# Patient Record
Sex: Female | Born: 1980 | Race: Black or African American | Hispanic: No | Marital: Married | Smoking: Never smoker
Health system: Southern US, Community
[De-identification: ages and names within clinical notes are randomized; demographics above are authoritative.]

## PROBLEM LIST (undated history)

## (undated) DIAGNOSIS — F329 Major depressive disorder, single episode, unspecified: Secondary | ICD-10-CM

## (undated) DIAGNOSIS — M797 Fibromyalgia: Secondary | ICD-10-CM

## (undated) DIAGNOSIS — F419 Anxiety disorder, unspecified: Secondary | ICD-10-CM

---
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
## 2001-04-03 ENCOUNTER — Other Ambulatory Visit: Admission: RE | Admit: 2001-04-03 | Discharge: 2001-04-03 | Payer: Self-pay | Admitting: Gynecology

## 2001-07-06 ENCOUNTER — Encounter: Payer: Self-pay | Admitting: Urology

## 2001-07-06 ENCOUNTER — Encounter: Admission: RE | Admit: 2001-07-06 | Discharge: 2001-07-06 | Payer: Self-pay | Admitting: Urology

## 2001-08-15 ENCOUNTER — Other Ambulatory Visit: Admission: RE | Admit: 2001-08-15 | Discharge: 2001-08-15 | Payer: Self-pay | Admitting: *Deleted

## 2001-10-10 ENCOUNTER — Inpatient Hospital Stay (HOSPITAL_COMMUNITY): Admission: AD | Admit: 2001-10-10 | Discharge: 2001-10-10 | Payer: Self-pay | Admitting: *Deleted

## 2002-02-01 ENCOUNTER — Other Ambulatory Visit: Admission: RE | Admit: 2002-02-01 | Discharge: 2002-02-01 | Payer: Self-pay | Admitting: *Deleted

## 2002-03-15 ENCOUNTER — Emergency Department (HOSPITAL_COMMUNITY): Admission: EM | Admit: 2002-03-15 | Discharge: 2002-03-15 | Payer: Self-pay | Admitting: Emergency Medicine

## 2002-07-11 ENCOUNTER — Inpatient Hospital Stay (HOSPITAL_COMMUNITY): Admission: AD | Admit: 2002-07-11 | Discharge: 2002-07-11 | Payer: Self-pay | Admitting: *Deleted

## 2002-08-08 ENCOUNTER — Inpatient Hospital Stay (HOSPITAL_COMMUNITY): Admission: AD | Admit: 2002-08-08 | Discharge: 2002-08-10 | Payer: Self-pay | Admitting: *Deleted

## 2003-04-03 ENCOUNTER — Other Ambulatory Visit: Admission: RE | Admit: 2003-04-03 | Discharge: 2003-04-03 | Payer: Self-pay | Admitting: *Deleted

## 2003-06-22 ENCOUNTER — Inpatient Hospital Stay (HOSPITAL_COMMUNITY): Admission: AD | Admit: 2003-06-22 | Discharge: 2003-06-22 | Payer: Self-pay | Admitting: *Deleted

## 2004-04-17 ENCOUNTER — Emergency Department (HOSPITAL_COMMUNITY): Admission: EM | Admit: 2004-04-17 | Discharge: 2004-04-18 | Payer: Self-pay | Admitting: *Deleted

## 2005-05-26 ENCOUNTER — Inpatient Hospital Stay (HOSPITAL_COMMUNITY): Admission: AD | Admit: 2005-05-26 | Discharge: 2005-05-26 | Payer: Self-pay | Admitting: Obstetrics

## 2005-11-16 ENCOUNTER — Inpatient Hospital Stay (HOSPITAL_COMMUNITY): Admission: AD | Admit: 2005-11-16 | Discharge: 2005-11-16 | Payer: Self-pay | Admitting: Obstetrics & Gynecology

## 2007-05-02 ENCOUNTER — Inpatient Hospital Stay (HOSPITAL_COMMUNITY): Admission: AD | Admit: 2007-05-02 | Discharge: 2007-05-02 | Payer: Self-pay | Admitting: Obstetrics

## 2008-03-06 ENCOUNTER — Emergency Department (HOSPITAL_COMMUNITY): Admission: EM | Admit: 2008-03-06 | Discharge: 2008-03-06 | Payer: Self-pay | Admitting: Emergency Medicine

## 2008-03-27 ENCOUNTER — Emergency Department (HOSPITAL_COMMUNITY): Admission: EM | Admit: 2008-03-27 | Discharge: 2008-03-27 | Payer: Self-pay | Admitting: Emergency Medicine

## 2008-03-30 ENCOUNTER — Emergency Department (HOSPITAL_COMMUNITY): Admission: EM | Admit: 2008-03-30 | Discharge: 2008-03-30 | Payer: Self-pay | Admitting: Emergency Medicine

## 2009-12-18 ENCOUNTER — Emergency Department (HOSPITAL_COMMUNITY): Admission: EM | Admit: 2009-12-18 | Discharge: 2009-12-18 | Payer: Self-pay | Admitting: Emergency Medicine

## 2010-10-09 ENCOUNTER — Ambulatory Visit (HOSPITAL_COMMUNITY): Payer: BC Managed Care – HMO | Admitting: Psychiatry

## 2010-10-09 DIAGNOSIS — F331 Major depressive disorder, recurrent, moderate: Secondary | ICD-10-CM

## 2010-10-23 ENCOUNTER — Encounter (HOSPITAL_COMMUNITY): Payer: BC Managed Care – HMO | Admitting: Psychiatry

## 2010-11-03 LAB — POCT I-STAT, CHEM 8
BUN: 11 mg/dL (ref 6–23)
Chloride: 108 mEq/L (ref 96–112)
Creatinine, Ser: 0.8 mg/dL (ref 0.4–1.2)
Potassium: 3.6 mEq/L (ref 3.5–5.1)
Sodium: 140 mEq/L (ref 135–145)
TCO2: 22 mmol/L (ref 0–100)
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
## 2011-01-01 NOTE — Op Note (Signed)
   NAME:  EVERLI, ROTHER NO.:  [id number]   MEDICAL RECORD NO.:  [id number]                   PATIENT TYPE:  INP   LOCATION:  9102                                 FACILITY:  [facility]   PHYSICIAN:  Georgina Peer, M.D.              DATE OF BIRTH:  1981-01-01   DATE OF PROCEDURE:  08/08/2002  DATE OF DISCHARGE:                                 OPERATIVE REPORT   OPERATIVE DELIVERY NOTE:   PREDELIVERY DIAGNOSES:  Pregnancy, 40-1/7 weeks, maternal fatigue in second  stage, vertex at +2 station.   POST-DELIVERY DIAGNOSES:  Pregnancy, 40-1/7 weeks, maternal fatigue in  second stage, vertex at +2 station, with viable female infant delivered at  1419, Apgars 8 and 9.   INDICATIONS:  A 30 year old, gravida 2, para 0, 0-1-0, EDC 08/07/02,  presented in labor, progressed to full dilatation at 1150, pushed for two  hours and became fatigued.  The vertex was at +2 station but did not descend  further.  She asked for assistance.  Vacuum assistance was offered with the  Kiwi vacuum.  Risk of accentuating caput scalp or head lacerations,  infection, or hemorrhage was also described and accepted.  The Kiwi was  applied with fetal heart rate reactive and within two contractions, with no  popoffs, a vaginal delivery was effected.  There was terminal bradycardia  with traction, and the infant was a female, delivered at 10, who was  immediately vigorous.  There were no anomalies.  Cord was cut after being  clamped.  There was a second-degree laceration from an edematous perineum  which was noted but no interference with the rectal mucosa or sphincter.  Again, Apgars were 8 and 9.  The perineum was repaired.  The placenta  delivered at 1428, three vessels intact.  It was bilobed and battledore with  no extraneous vessels at the outer margins of the membranes.  Blood loss was  400 cc.  The uterus was well contracted.  Anesthesia for this was epidural.                           Georgina Peer, M.D.    JPN/MEDQ  D:  08/08/2002  T:  08/09/2002  Job:  [id number]

## 2011-04-05 ENCOUNTER — Other Ambulatory Visit: Payer: Self-pay | Admitting: Obstetrics and Gynecology

## 2011-04-05 DIAGNOSIS — Z1231 Encounter for screening mammogram for malignant neoplasm of breast: Secondary | ICD-10-CM

## 2011-04-05 DIAGNOSIS — Z803 Family history of malignant neoplasm of breast: Secondary | ICD-10-CM

## 2011-04-14 ENCOUNTER — Ambulatory Visit: Payer: BC Managed Care – HMO

## 2011-04-22 ENCOUNTER — Ambulatory Visit: Payer: BC Managed Care – HMO

## 2011-05-10 ENCOUNTER — Ambulatory Visit
Admission: RE | Admit: 2011-05-10 | Discharge: 2011-05-10 | Disposition: A | Discharge status: Home / Self Care | Payer: BC Managed Care – HMO | Source: Ambulatory Visit | Attending: Obstetrics and Gynecology | Admitting: Obstetrics and Gynecology

## 2011-05-10 DIAGNOSIS — Z1231 Encounter for screening mammogram for malignant neoplasm of breast: Secondary | ICD-10-CM

## 2011-05-10 DIAGNOSIS — Z803 Family history of malignant neoplasm of breast: Secondary | ICD-10-CM

## 2011-05-14 LAB — POCT I-STAT, CHEM 8
Creatinine, Ser: 0.9
HCT: 36
Hemoglobin: 12.2
Potassium: 3 — ABNORMAL LOW
Sodium: 141
TCO2: 23

## 2011-05-14 LAB — POCT PREGNANCY, URINE
Operator id: 22937
Preg Test, Ur: POSITIVE

## 2011-05-14 LAB — ABO/RH: ABO/RH(D): A POS

## 2011-05-14 LAB — HCG, QUANTITATIVE, PREGNANCY: hCG, Beta Chain, Quant, S: 265 — ABNORMAL HIGH

## 2011-05-19 ENCOUNTER — Other Ambulatory Visit: Payer: Self-pay | Admitting: Obstetrics and Gynecology

## 2011-05-19 DIAGNOSIS — R928 Other abnormal and inconclusive findings on diagnostic imaging of breast: Secondary | ICD-10-CM

## 2011-05-25 ENCOUNTER — Ambulatory Visit
Admission: RE | Admit: 2011-05-25 | Discharge: 2011-05-25 | Disposition: A | Discharge status: Home / Self Care | Payer: BC Managed Care – HMO | Source: Ambulatory Visit | Attending: Obstetrics and Gynecology | Admitting: Obstetrics and Gynecology

## 2011-05-25 DIAGNOSIS — R928 Other abnormal and inconclusive findings on diagnostic imaging of breast: Secondary | ICD-10-CM

## 2011-05-27 LAB — GC/CHLAMYDIA PROBE AMP, GENITAL: GC Probe Amp, Genital: NEGATIVE

## 2011-05-27 LAB — WET PREP, GENITAL: Trich, Wet Prep: NONE SEEN

## 2011-05-27 LAB — POCT PREGNANCY, URINE
Operator id: 114931
Preg Test, Ur: NEGATIVE

## 2011-06-01 ENCOUNTER — Other Ambulatory Visit: Payer: BC Managed Care – HMO

## 2011-12-24 ENCOUNTER — Other Ambulatory Visit: Payer: Self-pay | Admitting: Obstetrics

## 2011-12-24 DIAGNOSIS — N644 Mastodynia: Secondary | ICD-10-CM

## 2011-12-31 ENCOUNTER — Ambulatory Visit
Admission: RE | Admit: 2011-12-31 | Discharge: 2011-12-31 | Disposition: A | Discharge status: Home / Self Care | Payer: BC Managed Care – PPO | Source: Ambulatory Visit | Attending: Obstetrics | Admitting: Obstetrics

## 2011-12-31 DIAGNOSIS — N644 Mastodynia: Secondary | ICD-10-CM

## 2012-02-28 ENCOUNTER — Encounter (HOSPITAL_COMMUNITY): Payer: Self-pay | Admitting: Emergency Medicine

## 2012-02-28 ENCOUNTER — Emergency Department (HOSPITAL_COMMUNITY)
Admission: EM | Admit: 2012-02-28 | Discharge: 2012-02-28 | Disposition: A | Discharge status: Home / Self Care | Payer: BC Managed Care – PPO | Attending: Emergency Medicine | Admitting: Emergency Medicine

## 2012-02-28 DIAGNOSIS — N898 Other specified noninflammatory disorders of vagina: Secondary | ICD-10-CM | POA: Insufficient documentation

## 2012-02-28 DIAGNOSIS — IMO0002 Reserved for concepts with insufficient information to code with codable children: Secondary | ICD-10-CM | POA: Insufficient documentation

## 2012-02-28 DIAGNOSIS — R350 Frequency of micturition: Secondary | ICD-10-CM | POA: Insufficient documentation

## 2012-02-28 DIAGNOSIS — F32A Depression, unspecified: Secondary | ICD-10-CM

## 2012-02-28 DIAGNOSIS — R319 Hematuria, unspecified: Secondary | ICD-10-CM | POA: Insufficient documentation

## 2012-02-28 DIAGNOSIS — R109 Unspecified abdominal pain: Secondary | ICD-10-CM | POA: Insufficient documentation

## 2012-02-28 DIAGNOSIS — N390 Urinary tract infection, site not specified: Secondary | ICD-10-CM

## 2012-02-28 DIAGNOSIS — N949 Unspecified condition associated with female genital organs and menstrual cycle: Secondary | ICD-10-CM | POA: Insufficient documentation

## 2012-02-28 DIAGNOSIS — F3289 Other specified depressive episodes: Secondary | ICD-10-CM | POA: Insufficient documentation

## 2012-02-28 DIAGNOSIS — F329 Major depressive disorder, single episode, unspecified: Secondary | ICD-10-CM | POA: Diagnosis present

## 2012-02-28 HISTORY — DX: Major depressive disorder, single episode, unspecified: F32.9

## 2012-02-28 HISTORY — DX: Depression, unspecified: F32.A

## 2012-02-28 LAB — WET PREP, GENITAL: Yeast Wet Prep HPF POC: NONE SEEN

## 2012-02-28 LAB — URINALYSIS, ROUTINE W REFLEX MICROSCOPIC
Glucose, UA: NEGATIVE mg/dL
Protein, ur: 100 mg/dL — AB
Specific Gravity, Urine: 1.033 — ABNORMAL HIGH (ref 1.005–1.030)

## 2012-02-28 LAB — URINE MICROSCOPIC-ADD ON

## 2012-02-28 LAB — POCT PREGNANCY, URINE: Preg Test, Ur: NEGATIVE

## 2012-02-28 MED ORDER — MORPHINE SULFATE 2 MG/ML IJ SOLN
2.0000 mg | Freq: Once | INTRAMUSCULAR | Status: AC
  Administered 2012-02-28: 2 mg via INTRAMUSCULAR
  Filled 2012-02-28: qty 1

## 2012-02-28 MED ORDER — CEPHALEXIN 500 MG PO CAPS: 500.0000 mg | ORAL_CAPSULE | Freq: Two times a day (BID) | ORAL | Status: AC

## 2012-02-28 MED ORDER — HYDROCODONE-ACETAMINOPHEN 5-325 MG PO TABS: 1.0000 | ORAL_TABLET | Freq: Four times a day (QID) | ORAL | Status: AC | PRN

## 2012-02-28 NOTE — ED Provider Notes (Signed)
History     CSN: [id number]  Arrival date & time 02/28/12  0222   First MD Initiated Contact with Patient 02/28/12 [phone]      Chief Complaint  Patient presents with  . Abdominal Pain  . Dysuria    (Consider location/radiation/quality/duration/timing/severity/associated sxs/prior treatment) HPI  31 year old F who presents with a 3 hour history of pain in her pelvic area and urinary frequency. She denies nausea, vomiting, fever, chills, vaginal discharge, and pain with intercourse. She is also having vaginal bleeding noted on toilet paper with a few drops in the bowl;She has no history of sick contacts; no history of abdominal or pelvic surgery. sees Dr. Clearance Coots GYN on a regular basis and no other doctors.  Past Medical History  Diagnosis Date  . Depression 02/28/2012    History reviewed. No pertinent past surgical history.  Family History  Problem Relation Age of Onset  . Breast cancer Mother 29  . Breast cancer      History  Substance Use Topics  . Smoking status: Never Smoker   . Smokeless tobacco: Not on file  . Alcohol Use: Yes    OB History    Grav Para Term Preterm Abortions TAB SAB Ect Mult Living                  Review of Systems  Constitutional: Negative.   HENT: Negative.   Eyes: Negative.   Respiratory: Negative.   Cardiovascular: Negative.   Gastrointestinal: Negative.   Genitourinary: Positive for frequency, hematuria and pelvic pain. Negative for flank pain, vaginal discharge, enuresis and genital sores.  Musculoskeletal: Negative.   Neurological: Negative.   Hematological: Negative.   Psychiatric/Behavioral: Negative.     Allergies  Review of patient's allergies indicates no known allergies.  Home Medications   Current Outpatient Rx  Name Route Sig Dispense Refill  . ZOLOFT PO Oral Take 1 tablet by mouth daily.    . CEPHALEXIN 500 MG PO CAPS Oral Take 1 capsule (500 mg total) by mouth 2 (two) times daily. 14 capsule 0  .  HYDROCODONE-ACETAMINOPHEN 5-325 MG PO TABS Oral Take 1 tablet by mouth every 6 (six) hours as needed for pain. 20 tablet 0    BP 118/70  Pulse 93  Temp 98 F (36.7 C) (Oral)  Resp 16  SpO2 100%  Physical Exam  Vitals reviewed. Constitutional: She is oriented to person, place, and time. She appears well-developed and well-nourished.       Young AAF, mildly distressed, conversant  HENT:  Head: Normocephalic and atraumatic.  Mouth/Throat: No oropharyngeal exudate.  Eyes: Conjunctivae and EOM are normal. Pupils are equal, round, and reactive to light.  Neck: Normal range of motion. Neck supple.  Cardiovascular: Normal rate, regular rhythm and normal heart sounds.   Pulmonary/Chest: Effort normal and breath sounds normal.  Abdominal: Soft. Normal appearance. There is tenderness in the right lower quadrant, suprapubic area and left lower quadrant. There is no rigidity, no rebound, no guarding, no CVA tenderness, no tenderness at McBurney's point and negative Murphy's sign.  Genitourinary: Pelvic exam was performed with patient prone. There is no rash or tenderness on the right labia. There is no rash or tenderness on the left labia. Cervix exhibits no motion tenderness and no discharge. Right adnexum displays no mass and no tenderness. Left adnexum displays no mass and no tenderness. Vaginal discharge found.  Musculoskeletal: Normal range of motion. She exhibits no edema and no tenderness.  Neurological: She is alert and  oriented to person, place, and time.  Skin: Skin is warm and dry. No rash noted. She is not diaphoretic.  Psychiatric: She has a normal mood and affect. Her behavior is normal. Thought content normal.    ED Course  Procedures (including critical care time)  Labs Reviewed  URINALYSIS, ROUTINE W REFLEX MICROSCOPIC - Abnormal; Notable for the following:    Color, Urine RED (*)  BIOCHEMICALS MAY BE AFFECTED BY COLOR   APPearance TURBID (*)     Specific Gravity, Urine 1.033  (*)     Hgb urine dipstick LARGE (*)     Bilirubin Urine SMALL (*)     Ketones, ur 15 (*)     Protein, ur 100 (*)     Nitrite POSITIVE (*)     Leukocytes, UA LARGE (*)     All other components within normal limits  URINE MICROSCOPIC-ADD ON - Abnormal; Notable for the following:    Squamous Epithelial / LPF FEW (*)     Bacteria, UA FEW (*)     All other components within normal limits  WET PREP, GENITAL - Abnormal; Notable for the following:    Clue Cells Wet Prep HPF POC FEW (*)     All other components within normal limits  POCT PREGNANCY, URINE  URINE CULTURE  GC/CHLAMYDIA PROBE AMP, GENITAL   No results found.   1. Urinary tract infection   2. Hematuria       MDM  Mrs. Landgren presents with hematuria, urinary frequency, and abdominal pain which correspond to the urinalysis that showed evidence of a urinary tract infection. She was afebrile, without nausea and vomiting, and had not CVA tenderness. Therefore, my suspicion for pyelonephritis is very low. She was given morphine, which relieved the pelvic pain. She was instructed to take PO antibiotics for 7 days upon discharge are follow-up with her PCP to make sure the hematuria resolved. The was stable, appropriate for discharge, and in agreement with the plan.         Garnetta Buddy, MD 02/28/12 [phone]

## 2012-02-28 NOTE — ED Provider Notes (Signed)
I  reviewed the resident's note and I agree with the findings and plan.    Nelia Shi, MD 02/28/12 [phone]

## 2012-02-28 NOTE — ED Notes (Signed)
Pt. D/C home. Ambulatory. A.O. X4. Denies pain. Instructed to complete full course of antibiotics. Instructed not to drive while on Norco, Instructed not to take Tylenol in addition to Norco. Instructed to increase fluids, and avoid sugary beverages. Instructed to follow-up with PCP if symptoms persist or get worse. Verbalized understanding.

## 2012-02-28 NOTE — ED Notes (Signed)
MD at bedside for pelvic exam.

## 2012-02-28 NOTE — ED Notes (Signed)
Patient currently resting quietly in bed; no respiratory or acute distress noted.  Patient updated on plan of care; informed patient that we are currently waiting on lab results to come back.  Patient has no other questions or concerns at this time; will continue to monitor. 

## 2012-02-28 NOTE — ED Notes (Signed)
Assisted resident with pelvic exam.

## 2012-02-28 NOTE — ED Notes (Signed)
Pelvic cart set up at bedside  

## 2012-02-28 NOTE — ED Notes (Signed)
Patient currently resting quietly in bed; no respiratory or acute distress noted.  Patient updated on plan of care; informed patient that EDP/resident will be at bedside to perform pelvic exam; patient has no other questions or concerns at this time; will continue to monitor.

## 2012-02-28 NOTE — ED Notes (Addendum)
Pt reports onset of abd pain and pain with urination began tonight also states she has blood in her urine also reports frequency, and small amounts urine output, LMP June 30th. Advil PM taken this PM w/o relief

## 2012-02-28 NOTE — ED Notes (Addendum)
PT. Reports being awakened from sleeping with bilateral lower abdominal pain and cramping. States "I went to the bathroom a few times and there was blood in my urine. Bright red the first time, then dull after." Pt. Denies clots in urine. Denies vaginal itching or discomfort. Denies order.  Denies and prior episode of this type of pain. Denies any other pain. A.O. X 4. Family at bedside. NAD.

## 2012-03-02 LAB — URINE CULTURE: Colony Count: 25000

## 2012-03-03 NOTE — ED Notes (Signed)
+  Urine. Patient treated with Keflex. Sensitive to same. Per protocol MD. °

## 2012-09-16 ENCOUNTER — Encounter (HOSPITAL_COMMUNITY): Payer: Self-pay | Admitting: Emergency Medicine

## 2012-09-16 ENCOUNTER — Emergency Department (HOSPITAL_COMMUNITY)
Admission: EM | Admit: 2012-09-16 | Discharge: 2012-09-16 | Disposition: A | Discharge status: Home / Self Care | Payer: BC Managed Care – PPO | Source: Home / Self Care | Attending: Emergency Medicine | Admitting: Emergency Medicine

## 2012-09-16 DIAGNOSIS — J069 Acute upper respiratory infection, unspecified: Secondary | ICD-10-CM

## 2012-09-16 HISTORY — DX: Fibromyalgia: M79.7

## 2012-09-16 MED ORDER — CETIRIZINE HCL 10 MG PO TABS: 10.0000 mg | ORAL_TABLET | Freq: Every day | ORAL | Status: DC

## 2012-09-16 MED ORDER — SALINE NASAL SPRAY 0.65 % NA SOLN: 1.0000 | NASAL | Status: DC | PRN

## 2012-09-16 NOTE — ED Provider Notes (Signed)
Medical screening examination/treatment/procedure(s) were performed by non-physician practitioner and as supervising physician I was immediately available for consultation/collaboration.  Leslee Home, M.D.   Reuben Likes, MD 09/16/12 [phone]

## 2012-09-16 NOTE — ED Provider Notes (Signed)
History     CSN: [id number]  Arrival date & time 09/16/12  1312   First MD Initiated Contact with Patient 09/16/12 1620      Chief Complaint  Patient presents with  . URI    hard to breathe. sinus pressure and pain. hurts to swallow    (Consider location/radiation/quality/duration/timing/severity/associated sxs/prior treatment) Patient is a 32 y.o. female presenting with URI. The history is provided by the patient.  URI The primary symptoms include sore throat and cough. The current episode started yesterday. This is a new problem. The problem has not changed since onset. The onset of the illness is associated with exposure to sick contacts. Symptoms associated with the illness include sinus pressure and congestion.    Past Medical History  Diagnosis Date  . Depression 02/28/2012  . Fibromyalgia     History reviewed. No pertinent past surgical history.  Family History  Problem Relation Age of Onset  . Breast cancer Mother 68  . Breast cancer      History  Substance Use Topics  . Smoking status: Never Smoker   . Smokeless tobacco: Not on file  . Alcohol Use: Yes    OB History    Grav Para Term Preterm Abortions TAB SAB Ect Mult Living                  Review of Systems  HENT: Positive for congestion, sore throat and sinus pressure.   Respiratory: Positive for cough.   All other systems reviewed and are negative.    Allergies  Review of patient's allergies indicates no known allergies.  Home Medications   Current Outpatient Rx  Name  Route  Sig  Dispense  Refill  . FLEXERIL PO   Oral   Take by mouth.         . TRAMADOL HCL 50 MG PO TABS   Oral   Take 50 mg by mouth every 6 (six) hours as needed.         Marland Kitchen ZOLOFT PO   Oral   Take 1 tablet by mouth daily.           BP 139/90  Pulse 80  Temp 98.4 F (36.9 C) (Oral)  Resp 18  SpO2 100%  LMP 08/15/2012  Physical Exam  Nursing note and vitals reviewed. Constitutional: She is oriented  to person, place, and time. Vital signs are normal. She appears well-developed and well-nourished. She is active and cooperative.  HENT:  Head: Normocephalic.  Right Ear: External ear normal. A middle ear effusion is present.  Left Ear: Tympanic membrane and external ear normal.  Nose: Nose normal. Right sinus exhibits no maxillary sinus tenderness and no frontal sinus tenderness. Left sinus exhibits no maxillary sinus tenderness and no frontal sinus tenderness.  Mouth/Throat: Uvula is midline, oropharynx is clear and moist and mucous membranes are normal.  Eyes: Conjunctivae normal are normal. Pupils are equal, round, and reactive to light. No scleral icterus.  Neck: Trachea normal and normal range of motion. Neck supple.  Cardiovascular: Normal rate, regular rhythm, normal heart sounds, intact distal pulses and normal pulses.   Pulmonary/Chest: Effort normal and breath sounds normal.  Lymphadenopathy:       Head (right side): No submental, no submandibular, no tonsillar, no preauricular, no posterior auricular and no occipital adenopathy present.       Head (left side): No submental, no submandibular, no tonsillar, no preauricular, no posterior auricular and no occipital adenopathy present.    She has  no cervical adenopathy.  Neurological: She is alert and oriented to person, place, and time. No cranial nerve deficit or sensory deficit. GCS eye subscore is 4. GCS verbal subscore is 5. GCS motor subscore is 6.  Skin: Skin is warm and dry.  Psychiatric: She has a normal mood and affect. Her speech is normal and behavior is normal. Judgment and thought content normal. Cognition and memory are normal.    ED Course  Procedures (including critical care time)  Labs Reviewed - No data to display No results found.   1. URI (upper respiratory infection)       MDM  Increase fluid intake, rest.  Antibiotics not indicated.  Begin expectorant/decongestant, topical decongestant, saline nasal  spray and/or saline irrigation, and cough suppressant at bedtime. Antihistamines of your choice (Claritin or Zyrtec).  Tylenol or Motrin for fever/discomfort.  Followup with PCP if not improving 5 to 7 days.       Johnsie Kindred, NP 09/16/12 1629

## 2012-09-16 NOTE — ED Notes (Signed)
Pt c/o sinus pressure and pain. Chest congestion. Hurts to swallow and is hard to breathe at times. Pt denies fever, n/v/d.  Pt has not tried any meds for symptoms. Symptoms present x the past ten hours.

## 2012-09-26 ENCOUNTER — Other Ambulatory Visit: Payer: Self-pay | Admitting: Obstetrics

## 2012-09-26 DIAGNOSIS — N644 Mastodynia: Secondary | ICD-10-CM

## 2012-11-01 ENCOUNTER — Ambulatory Visit: Payer: BC Managed Care – PPO | Admitting: Sports Medicine

## 2012-12-18 ENCOUNTER — Telehealth: Payer: Self-pay | Admitting: *Deleted

## 2012-12-18 DIAGNOSIS — B9689 Other specified bacterial agents as the cause of diseases classified elsewhere: Secondary | ICD-10-CM

## 2012-12-18 MED ORDER — METRONIDAZOLE 0.75 % VA GEL: 1.0000 | Freq: Every day | VAGINAL | Status: DC

## 2012-12-18 NOTE — Telephone Encounter (Signed)
Patient states that she has an abnormal discharge and is requesting [facility] Gel.  Patient states that she has been diagnosed with BV in the past.  Denies odor, pain, or itching. Ok per Dr. Clearance Coots - Metro Gel called to her pharmacy per nursing protocol. Patient request for hydrocodone was denied by Dr. Clearance Coots for her fibromyalgia.

## 2013-01-01 ENCOUNTER — Other Ambulatory Visit: Payer: Self-pay | Admitting: Obstetrics

## 2013-01-01 ENCOUNTER — Ambulatory Visit
Admission: RE | Admit: 2013-01-01 | Discharge: 2013-01-01 | Disposition: A | Discharge status: Home / Self Care | Payer: BC Managed Care – PPO | Source: Ambulatory Visit | Attending: Obstetrics | Admitting: Obstetrics

## 2013-01-01 DIAGNOSIS — N644 Mastodynia: Secondary | ICD-10-CM

## 2013-01-02 ENCOUNTER — Encounter: Payer: Self-pay | Admitting: Obstetrics

## 2013-01-11 ENCOUNTER — Encounter (HOSPITAL_COMMUNITY): Payer: Self-pay | Admitting: *Deleted

## 2013-01-11 DIAGNOSIS — F329 Major depressive disorder, single episode, unspecified: Secondary | ICD-10-CM | POA: Insufficient documentation

## 2013-01-11 DIAGNOSIS — F411 Generalized anxiety disorder: Secondary | ICD-10-CM | POA: Insufficient documentation

## 2013-01-11 DIAGNOSIS — F3289 Other specified depressive episodes: Secondary | ICD-10-CM | POA: Insufficient documentation

## 2013-01-11 DIAGNOSIS — IMO0001 Reserved for inherently not codable concepts without codable children: Secondary | ICD-10-CM | POA: Insufficient documentation

## 2013-01-11 NOTE — ED Notes (Signed)
[facility] today. Woke up with sob. No sob when sitting. Feel fatigued. X-ray showed enlarged heart. No ankle swelling.

## 2013-01-12 ENCOUNTER — Emergency Department (HOSPITAL_COMMUNITY)
Admission: EM | Admit: 2013-01-12 | Discharge: 2013-01-12 | Disposition: A | Discharge status: Home / Self Care | Payer: BC Managed Care – PPO | Attending: Emergency Medicine | Admitting: Emergency Medicine

## 2013-01-12 ENCOUNTER — Emergency Department (HOSPITAL_COMMUNITY)
Admit: 2013-01-12 | Discharge: 2013-01-12 | Disposition: A | Discharge status: Home / Self Care | Payer: BC Managed Care – PPO | Attending: Emergency Medicine | Admitting: Emergency Medicine

## 2013-01-12 DIAGNOSIS — F410 Panic disorder [episodic paroxysmal anxiety] without agoraphobia: Secondary | ICD-10-CM

## 2013-01-12 LAB — CBC
MCH: 29.2 pg (ref 26.0–34.0)
MCHC: 33.9 g/dL (ref 30.0–36.0)
MCV: 86 fL (ref 78.0–100.0)
Platelets: 388 10*3/uL (ref 150–400)
RDW: 13.1 % (ref 11.5–15.5)

## 2013-01-12 LAB — BASIC METABOLIC PANEL
Calcium: 9.4 mg/dL (ref 8.4–10.5)
Creatinine, Ser: 0.78 mg/dL (ref 0.50–1.10)
GFR calc Af Amer: >90 mL/min (ref >90)

## 2013-01-12 LAB — TROPONIN I: Troponin I: <0.3 ng/mL (ref <0.30)

## 2013-01-12 MED ORDER — ALPRAZOLAM 0.5 MG PO TABS: 0.5000 mg | ORAL_TABLET | Freq: Every evening | ORAL | Status: DC | PRN

## 2013-01-12 NOTE — ED Notes (Signed)
Delay explained to pt.  

## 2013-01-12 NOTE — ED Provider Notes (Signed)
History     CSN: [id number]  Arrival date & time 01/11/13  2337   First MD Initiated Contact with Patient 01/12/13 [phone]      Chief Complaint  Patient presents with  . Shortness of Breath    (Consider location/radiation/quality/duration/timing/severity/associated sxs/prior treatment) Patient is a 32 y.o. female presenting with shortness of breath. The history is provided by the patient. No language interpreter was used.  Shortness of Breath Severity:  Moderate Onset quality:  Sudden Timing:  Constant Progression:  Worsening Chronicity:  New Context: not activity   Relieved by:  Nothing Worsened by:  Nothing tried Ineffective treatments:  None tried Associated symptoms: no sore throat   Risk factors: no hx of PE/DVT     Past Medical History  Diagnosis Date  . Depression 02/28/2012  . Fibromyalgia     History reviewed. No pertinent past surgical history.  Family History  Problem Relation Age of Onset  . Breast cancer Mother 79  . Breast cancer      History  Substance Use Topics  . Smoking status: Never Smoker   . Smokeless tobacco: Not on file  . Alcohol Use: Yes    OB History   Grav Para Term Preterm Abortions TAB SAB Ect Mult Living                  Review of Systems  HENT: Negative for sore throat.   Respiratory: Positive for shortness of breath.   All other systems reviewed and are negative.    Allergies  Review of patient's allergies indicates no known allergies.  Home Medications   Current Outpatient Rx  Name  Route  Sig  Dispense  Refill  . ALPRAZolam (XANAX) 0.5 MG tablet   Oral   Take 0.5 mg by mouth 3 (three) times daily as needed for anxiety.         . traMADol (ULTRAM) 50 MG tablet   Oral   Take 50 mg by mouth every 6 (six) hours as needed.           BP 107/72  Pulse 78  Temp(Src) 98.1 F (36.7 C) (Oral)  Resp 18  SpO2 100%  LMP 01/08/2013  Physical Exam  Nursing note and vitals reviewed. Constitutional: She is  oriented to person, place, and time. She appears well-developed and well-nourished.  HENT:  Head: Normocephalic.  Right Ear: External ear normal.  Left Ear: External ear normal.  Eyes: Conjunctivae are normal. Pupils are equal, round, and reactive to light.  Neck: Normal range of motion.  Cardiovascular: Normal rate and normal heart sounds.   Pulmonary/Chest: Effort normal and breath sounds normal.  Abdominal: Soft.  Musculoskeletal: Normal range of motion.  Neurological: She is alert and oriented to person, place, and time. She has normal reflexes.  Skin: Skin is warm.  Psychiatric: She has a normal mood and affect.    ED Course  Procedures (including critical care time)  Labs Reviewed  CBC - Abnormal; Notable for the following:    HCT 35.7 (*)    All other components within normal limits  BASIC METABOLIC PANEL - Abnormal; Notable for the following:    Glucose, Bld 100 (*)    All other components within normal limits  TROPONIN I   Dg Chest 2 View  01/12/2013   *RADIOLOGY REPORT*  Clinical Data: Shortness of breath and lethargy.  CHEST - 2 VIEW  Comparison: Chest radiograph performed 12/18/2009  Findings: The lungs are well-aerated and clear.  There is  no evidence of focal opacification, pleural effusion or pneumothorax. Mild bibasilar density is thought to reflect overlying soft tissues.  The heart is borderline normal in size; the mediastinal contour is within normal limits.  No acute osseous abnormalities are seen.  IMPRESSION: No acute cardiopulmonary process seen.   Original Report Authenticated By: Tonia Ghent, M.D.     No diagnosis found.    Date: 01/12/2013  Rate: 72  Rhythm: normal sinus rhythm  QRS Axis: normal  Intervals: normal  ST/T Wave abnormalities: normal  Conduction Disutrbances:none  Narrative Interpretation:   Old EKG Reviewed: none available   MDM   Results for orders placed during the hospital encounter of 01/12/13  CBC      Result Value Range    WBC 6.8  4.0 - 10.5 K/uL   RBC 4.15  3.87 - 5.11 MIL/uL   Hemoglobin 12.1  12.0 - 15.0 g/dL   HCT 16.1 (*) 09.6 - 04.5 %   MCV 86.0  78.0 - 100.0 fL   MCH 29.2  26.0 - 34.0 pg   MCHC 33.9  30.0 - 36.0 g/dL   RDW 40.9  81.1 - 91.4 %   Platelets 388  150 - 400 K/uL  BASIC METABOLIC PANEL      Result Value Range   Sodium 137  135 - 145 mEq/L   Potassium 3.6  3.5 - 5.1 mEq/L   Chloride 101  96 - 112 mEq/L   CO2 25  19 - 32 mEq/L   Glucose, Bld 100 (*) 70 - 99 mg/dL   BUN 11  6 - 23 mg/dL   Creatinine, Ser 7.82  0.50 - 1.10 mg/dL   Calcium 9.4  8.4 - 95.6 mg/dL   GFR calc non Af Amer >90  >90 mL/min   GFR calc Af Amer >90  >90 mL/min  TROPONIN I      Result Value Range   Troponin I <0.30  <0.30 ng/mL  D-DIMER, QUANTITATIVE      Result Value Range   D-Dimer, Quant <0.27  0.00 - 0.48 ug/mL-FEU   Dg Chest 2 View  01/12/2013   *RADIOLOGY REPORT*  Clinical Data: Shortness of breath and lethargy.  CHEST - 2 VIEW  Comparison: Chest radiograph performed 12/18/2009  Findings: The lungs are well-aerated and clear.  There is no evidence of focal opacification, pleural effusion or pneumothorax. Mild bibasilar density is thought to reflect overlying soft tissues.  The heart is borderline normal in size; the mediastinal contour is within normal limits.  No acute osseous abnormalities are seen.  IMPRESSION: No acute cardiopulmonary process seen.   Original Report Authenticated By: Tonia Ghent, M.D.   US Breast Right  01/01/2013   *RADIOLOGY REPORT*  Clinical Data:  Focal tenderness within the upper-outer quadrant right breast and also within the superior medial right breast. There is a family history of breast cancer in mother at age 82.  DIGITAL DIAGNOSTIC BILATERAL MAMMOGRAM WITH CAD AND RIGHT BREAST ULTRASOUND:  Comparison:  12/31/2011, 05/25/2011, 05/10/2011, 01/10/2009, 08/28/2007.  Findings:  ACR Breast Density Category 3: The breast tissue is heterogeneously dense.  There is no mass,  distortion, or worrisome calcification within either breast.  The parenchymal pattern is stable.  Mammographic images were processed with CAD.  On physical exam, there is no discrete palpable abnormality within the upper-outer quadrant of the right breast or right axilla. Also, there is no discrete palpable abnormality within the upper inner quadrant of the right breast.  The area of the  patient's palpable concern within the upper inner quadrant right breast corresponds to a mildly prominent costochondral junction.  Ultrasound is performed, showing normal-appearing fibroglandular tissue within the upper-outer quadrant of the right breast and lower axillary portion of the right breast.  Also, normal fibroglandular tissue is present within the upper inner quadrant of the right breast.  IMPRESSION: No findings worrisome for malignancy.  Breast self-examination was reviewed with the patient.  RECOMMENDATION: Screening mammography in 1 year due to the patient's family history.  I have discussed the findings and recommendations with the patient. Results were also provided in writing at the conclusion of the visit.  If applicable, a reminder letter will be sent to the patient regarding the next appointment.  BI-RADS CATEGORY 1:  Negative.   Original Report Authenticated By: Rolla Plate, M.D.   Mm Digital Diagnostic Bilat  01/01/2013   *RADIOLOGY REPORT*  Clinical Data:  Focal tenderness within the upper-outer quadrant right breast and also within the superior medial right breast. There is a family history of breast cancer in mother at age 65.  DIGITAL DIAGNOSTIC BILATERAL MAMMOGRAM WITH CAD AND RIGHT BREAST ULTRASOUND:  Comparison:  12/31/2011, 05/25/2011, 05/10/2011, 01/10/2009, 08/28/2007.  Findings:  ACR Breast Density Category 3: The breast tissue is heterogeneously dense.  There is no mass, distortion, or worrisome calcification within either breast.  The parenchymal pattern is stable.  Mammographic images  were processed with CAD.  On physical exam, there is no discrete palpable abnormality within the upper-outer quadrant of the right breast or right axilla. Also, there is no discrete palpable abnormality within the upper inner quadrant of the right breast.  The area of the patient's palpable concern within the upper inner quadrant right breast corresponds to a mildly prominent costochondral junction.  Ultrasound is performed, showing normal-appearing fibroglandular tissue within the upper-outer quadrant of the right breast and lower axillary portion of the right breast.  Also, normal fibroglandular tissue is present within the upper inner quadrant of the right breast.  IMPRESSION: No findings worrisome for malignancy.  Breast self-examination was reviewed with the patient.  RECOMMENDATION: Screening mammography in 1 year due to the patient's family history.  I have discussed the findings and recommendations with the patient. Results were also provided in writing at the conclusion of the visit.  If applicable, a reminder letter will be sent to the patient regarding the next appointment.  BI-RADS CATEGORY 1:  Negative.   Original Report Authenticated By: Rolla Plate, M.D.    I suspect anxiety.   EKG normal,  ddimer normal.   Pt given rx for xanax 15.   Pt advised to follow up with primary care MD for recheck      Elson Areas, PA-C 01/12/13 4540

## 2013-01-12 NOTE — ED Notes (Signed)
PA-C at bedside with pt.

## 2013-01-12 NOTE — ED Notes (Signed)
RN paged lab for D-Dimer add on, however lab unable to use tube that was sent. Blood needs to be re drawn.

## 2013-01-12 NOTE — ED Notes (Signed)
NURSE FIRST ROUNDS ; NURSE EXPLAINED DELAY /WAIT TIME / PROCESS TO PT. NO DISTRESS , RESPIRATIONS UNLABORED , DENIES PAIN .

## 2013-01-13 NOTE — ED Provider Notes (Signed)
Medical screening examination/treatment/procedure(s) were performed by non-physician practitioner and as supervising physician I was immediately available for consultation/collaboration.   Julie Manly, MD 01/13/13 0019 

## 2013-03-01 ENCOUNTER — Other Ambulatory Visit: Payer: Self-pay | Admitting: Obstetrics

## 2013-03-01 DIAGNOSIS — N63 Unspecified lump in unspecified breast: Secondary | ICD-10-CM

## 2013-03-13 ENCOUNTER — Ambulatory Visit
Admission: RE | Admit: 2013-03-13 | Discharge: 2013-03-13 | Disposition: A | Discharge status: Home / Self Care | Payer: BC Managed Care – PPO | Source: Ambulatory Visit | Attending: Obstetrics | Admitting: Obstetrics

## 2013-03-13 DIAGNOSIS — N63 Unspecified lump in unspecified breast: Secondary | ICD-10-CM

## 2013-06-19 ENCOUNTER — Encounter: Payer: Self-pay | Admitting: Obstetrics

## 2013-06-19 ENCOUNTER — Ambulatory Visit (INDEPENDENT_AMBULATORY_CARE_PROVIDER_SITE_OTHER): Payer: BC Managed Care – PPO | Admitting: Obstetrics

## 2013-06-19 VITALS — BP 124/77 | HR 105 | Temp 98.0°F | Ht 64.5 in | Wt 164.0 lb

## 2013-06-19 DIAGNOSIS — N390 Urinary tract infection, site not specified: Secondary | ICD-10-CM | POA: Insufficient documentation

## 2013-06-19 LAB — POCT URINALYSIS DIPSTICK
Protein, UA: NEGATIVE
Spec Grav, UA: 1.015
Urobilinogen, UA: NEGATIVE

## 2013-06-19 MED ORDER — NITROFURANTOIN MONOHYD MACRO 100 MG PO CAPS: 100.0000 mg | ORAL_CAPSULE | Freq: Two times a day (BID) | ORAL | Status: DC

## 2013-06-19 NOTE — Progress Notes (Signed)
Subjective:     Dana Escobar is a 32 y.o. female here for a routine exam.  Current complaints:  Pt states she believes that she has discomfort with urination. Pt states she had some lower back pain a few days ago that has subsided.   Personal health questionnaire reviewed: yes.   Gynecologic History Patient's last menstrual period was 06/01/2013. Contraception: condoms  Obstetric History OB History  No data available     The following portions of the patient's history were reviewed and updated as appropriate: allergies, current medications, past family history, past medical history, past social history, past surgical history and problem list.  Review of Systems Pertinent items are noted in HPI.    Objective:    No exam performed today, Patient too early for Annual.  Rescheduled..    Assessment:   UTI. Positive Nitrites. Culture sent to the lab.    Plan:  Macrobid 100 mg Sig: 1 po BID for 7 days. No refills sent to the pharmacy.

## 2013-06-21 LAB — URINE CULTURE: Colony Count: >=100000

## 2013-06-29 ENCOUNTER — Ambulatory Visit: Payer: BC Managed Care – PPO | Admitting: Obstetrics

## 2013-07-02 ENCOUNTER — Ambulatory Visit: Payer: BC Managed Care – PPO | Admitting: Obstetrics

## 2013-07-05 ENCOUNTER — Encounter: Payer: Self-pay | Admitting: Obstetrics

## 2013-07-05 ENCOUNTER — Ambulatory Visit (INDEPENDENT_AMBULATORY_CARE_PROVIDER_SITE_OTHER): Payer: BC Managed Care – PPO | Admitting: Obstetrics

## 2013-07-05 VITALS — BP 121/81 | HR 77 | Temp 97.9°F | Ht 64.0 in | Wt 162.0 lb

## 2013-07-05 DIAGNOSIS — Z01419 Encounter for gynecological examination (general) (routine) without abnormal findings: Secondary | ICD-10-CM

## 2013-07-05 DIAGNOSIS — Z23 Encounter for immunization: Secondary | ICD-10-CM

## 2013-07-05 DIAGNOSIS — Z0000 Encounter for general adult medical examination without abnormal findings: Secondary | ICD-10-CM

## 2013-07-05 DIAGNOSIS — N760 Acute vaginitis: Secondary | ICD-10-CM

## 2013-07-05 DIAGNOSIS — F419 Anxiety disorder, unspecified: Secondary | ICD-10-CM | POA: Insufficient documentation

## 2013-07-05 DIAGNOSIS — N946 Dysmenorrhea, unspecified: Secondary | ICD-10-CM | POA: Insufficient documentation

## 2013-07-05 DIAGNOSIS — B9689 Other specified bacterial agents as the cause of diseases classified elsewhere: Secondary | ICD-10-CM | POA: Insufficient documentation

## 2013-07-05 DIAGNOSIS — Z113 Encounter for screening for infections with a predominantly sexual mode of transmission: Secondary | ICD-10-CM

## 2013-07-05 LAB — POCT URINALYSIS DIPSTICK
Glucose, UA: NEGATIVE
Leukocytes, UA: NEGATIVE
Spec Grav, UA: 1.02
Urobilinogen, UA: NEGATIVE

## 2013-07-05 LAB — WET PREP BY MOLECULAR PROBE
Gardnerella vaginalis: NEGATIVE
Trichomonas vaginosis: NEGATIVE

## 2013-07-05 LAB — POCT URINE PREGNANCY: Preg Test, Ur: NEGATIVE

## 2013-07-05 MED ORDER — METRONIDAZOLE 0.75 % VA GEL: 1.0000 | Freq: Two times a day (BID) | VAGINAL | Status: DC

## 2013-07-05 MED ORDER — ALPRAZOLAM 0.5 MG PO TABS: 0.5000 mg | ORAL_TABLET | Freq: Every evening | ORAL | Status: DC | PRN

## 2013-07-05 MED ORDER — TRAMADOL HCL 50 MG PO TABS: 50.0000 mg | ORAL_TABLET | Freq: Four times a day (QID) | ORAL | Status: DC | PRN

## 2013-07-05 NOTE — Progress Notes (Signed)
Subjective:     Dana Escobar is a 32 y.o. female here for a routine exam.  Current complaints: would like to get flu shot and TB shot.  Personal health questionnaire reviewed: yes.   Gynecologic History Patient's last menstrual period was 05/27/2013. Contraception: condoms Last Pap: 06/2012. Results were: normal Last mammogram: 02/2013. Results were: normal  Obstetric History OB History  No data available     The following portions of the patient's history were reviewed and updated as appropriate: allergies, current medications, past family history, past medical history, past social history, past surgical history and problem list.  Review of Systems Pertinent items are noted in HPI.    Objective:    General appearance: alert and no distress Abdomen: normal findings: soft, non-tender Pelvic: cervix normal in appearance, external genitalia normal, no adnexal masses or tenderness, no cervical motion tenderness, rectovaginal septum normal, uterus normal size, shape, and consistency and vagina normal without discharge    Assessment:    Healthy female exam.    Plan:    F/U 1 year.

## 2013-07-06 ENCOUNTER — Encounter: Payer: Self-pay | Admitting: Obstetrics

## 2013-07-06 ENCOUNTER — Telehealth: Payer: Self-pay | Admitting: *Deleted

## 2013-07-06 LAB — PAP IG W/ RFLX HPV ASCU

## 2013-07-06 LAB — HIV ANTIBODY (ROUTINE TESTING W REFLEX): HIV: NONREACTIVE

## 2013-07-06 LAB — HEPATITIS B SURFACE ANTIGEN: Hepatitis B Surface Ag: NEGATIVE

## 2013-07-06 NOTE — Telephone Encounter (Signed)
Call placed to patient per Dr Clearance Coots instructions; patient informed blood work performed in office was normal. Patient was informed pap was not resulted and once results were received and reviewed by the doctor she would be informed of results. Patient verbalized understanding and agrees as instructed.

## 2013-08-21 ENCOUNTER — Ambulatory Visit (INDEPENDENT_AMBULATORY_CARE_PROVIDER_SITE_OTHER): Payer: BC Managed Care – PPO | Admitting: Obstetrics

## 2013-08-21 ENCOUNTER — Encounter: Payer: Self-pay | Admitting: Obstetrics

## 2013-08-21 VITALS — BP 122/80 | HR 74 | Temp 98.0°F | Ht 64.0 in | Wt 166.0 lb

## 2013-08-21 DIAGNOSIS — Q849 Congenital malformation of integument, unspecified: Secondary | ICD-10-CM

## 2013-08-21 DIAGNOSIS — N946 Dysmenorrhea, unspecified: Secondary | ICD-10-CM

## 2013-08-21 DIAGNOSIS — Z0000 Encounter for general adult medical examination without abnormal findings: Secondary | ICD-10-CM

## 2013-08-21 DIAGNOSIS — L989 Disorder of the skin and subcutaneous tissue, unspecified: Secondary | ICD-10-CM

## 2013-08-21 MED ORDER — PNV PRENATAL PLUS MULTIVITAMIN 27-1 MG PO TABS: 1.0000 | ORAL_TABLET | Freq: Every day | ORAL | Status: DC

## 2013-08-21 MED ORDER — OXYCODONE HCL 10 MG PO TABS: 10.0000 mg | ORAL_TABLET | Freq: Four times a day (QID) | ORAL | Status: DC | PRN

## 2013-08-21 NOTE — Progress Notes (Signed)
Subjective:     Dana I Myrtie HawkKulii is a 33 y.o. female here for a routine exam.  Current complaints: problem visit. Pt states she has noticed an unusual indentation in her right breast. Pt states she noticed it about 2 weeks ago. Pt states is is not causing any pain or discomfort.    Personal health questionnaire reviewed: yes.   Gynecologic History Patient's last menstrual period was 07/27/2013. Contraception: condoms Last Pap: 06/2013. Results were: normal Last mammogram: 02/2013. Results were: normal  Obstetric History OB History  No data available     The following portions of the patient's history were reviewed and updated as appropriate: allergies, current medications, past family history, past medical history, past social history, past surgical history and problem list.  Review of Systems Pertinent items are noted in HPI.    Objective:    General appearance: alert and no distress Breasts: normal appearance, no masses or tenderness                    Assessment:    Healthy female exam.    Plan:    Education reviewed: self breast exams and management of dry skin.. Follow up in: several months. Vaseline Intensive Care recommended.

## 2013-08-27 ENCOUNTER — Other Ambulatory Visit: Payer: BC Managed Care – PPO

## 2013-08-27 DIAGNOSIS — Z01419 Encounter for gynecological examination (general) (routine) without abnormal findings: Secondary | ICD-10-CM

## 2013-08-27 LAB — CBC WITH DIFFERENTIAL/PLATELET
Basophils Absolute: 0 10*3/uL (ref 0.0–0.1)
Basophils Relative: 0 % (ref 0–1)
Eosinophils Absolute: 0.2 10*3/uL (ref 0.0–0.7)
Eosinophils Relative: 4 % (ref 0–5)
HCT: 35 % — ABNORMAL LOW (ref 36.0–46.0)
Hemoglobin: 11.7 g/dL — ABNORMAL LOW (ref 12.0–15.0)
LYMPHS ABS: 2.2 10*3/uL (ref 0.7–4.0)
Lymphocytes Relative: 49 % — ABNORMAL HIGH (ref 12–46)
MCH: 28.9 pg (ref 26.0–34.0)
MCHC: 33.4 g/dL (ref 30.0–36.0)
MCV: 86.4 fL (ref 78.0–100.0)
Monocytes Absolute: 0.4 10*3/uL (ref 0.1–1.0)
Monocytes Relative: 9 % (ref 3–12)
NEUTROS ABS: 1.7 10*3/uL (ref 1.7–7.7)
NEUTROS PCT: 38 % — AB (ref 43–77)
Platelets: 404 10*3/uL — ABNORMAL HIGH (ref 150–400)
RBC: 4.05 MIL/uL (ref 3.87–5.11)
RDW: 14.1 % (ref 11.5–15.5)
WBC: 4.5 10*3/uL (ref 4.0–10.5)

## 2013-08-27 LAB — COMPREHENSIVE METABOLIC PANEL
ALK PHOS: 51 U/L (ref 39–117)
ALT: 18 U/L (ref 0–35)
AST: 20 U/L (ref 0–37)
Albumin: 4.1 g/dL (ref 3.5–5.2)
BUN: 9 mg/dL (ref 6–23)
CO2: 27 meq/L (ref 19–32)
Calcium: 9.1 mg/dL (ref 8.4–10.5)
Chloride: 103 mEq/L (ref 96–112)
Creat: 0.75 mg/dL (ref 0.50–1.10)
Glucose, Bld: 70 mg/dL (ref 70–99)
Potassium: 4.2 mEq/L (ref 3.5–5.3)
SODIUM: 137 meq/L (ref 135–145)
TOTAL PROTEIN: 7.3 g/dL (ref 6.0–8.3)
Total Bilirubin: 0.3 mg/dL (ref 0.3–1.2)

## 2013-08-27 LAB — CHOLESTEROL, TOTAL: Cholesterol: 159 mg/dL (ref 0–200)

## 2013-08-27 LAB — TRIGLYCERIDES: Triglycerides: 86 mg/dL (ref <150)

## 2013-08-27 LAB — HDL CHOLESTEROL: HDL: 47 mg/dL (ref >39)

## 2013-08-28 LAB — TSH: TSH: 1.72 u[IU]/mL (ref 0.350–4.500)

## 2013-12-06 ENCOUNTER — Other Ambulatory Visit: Payer: Self-pay

## 2013-12-06 DIAGNOSIS — Z1231 Encounter for screening mammogram for malignant neoplasm of breast: Secondary | ICD-10-CM

## 2013-12-20 ENCOUNTER — Ambulatory Visit (INDEPENDENT_AMBULATORY_CARE_PROVIDER_SITE_OTHER): Payer: BC Managed Care – PPO | Admitting: Obstetrics

## 2013-12-20 ENCOUNTER — Encounter: Payer: Self-pay | Admitting: Obstetrics

## 2013-12-20 ENCOUNTER — Other Ambulatory Visit: Payer: Self-pay | Admitting: Obstetrics

## 2013-12-20 VITALS — BP 126/82 | HR 79 | Temp 98.5°F | Wt 159.0 lb

## 2013-12-20 DIAGNOSIS — N946 Dysmenorrhea, unspecified: Secondary | ICD-10-CM

## 2013-12-20 DIAGNOSIS — N760 Acute vaginitis: Secondary | ICD-10-CM

## 2013-12-20 DIAGNOSIS — N63 Unspecified lump in unspecified breast: Secondary | ICD-10-CM

## 2013-12-20 DIAGNOSIS — B9689 Other specified bacterial agents as the cause of diseases classified elsewhere: Secondary | ICD-10-CM

## 2013-12-20 DIAGNOSIS — A499 Bacterial infection, unspecified: Secondary | ICD-10-CM

## 2013-12-20 MED ORDER — METRONIDAZOLE 0.75 % VA GEL
1.0000 | Freq: Two times a day (BID) | VAGINAL | Status: DC
Start: 2013-12-20 — End: 2014-03-14

## 2013-12-20 MED ORDER — OXYCODONE HCL 10 MG PO TABS: 10.0000 mg | ORAL_TABLET | Freq: Four times a day (QID) | ORAL | Status: DC | PRN

## 2013-12-20 NOTE — Progress Notes (Signed)
Patient ID: Dana Escobar, female   DOB: 1981-08-02, 33 y.o.   MRN: [id number]  Chief Complaint  Patient presents with  . Breast Problem    quater size mass at right breast and armpit.  Pt had noticed for approx 2 weeks    HPI Dana Escobar is a 33 y.o. female.  Has felt quarter size lump in right breast in axillary area over the past 2 weeks.  HPI  Past Medical History  Diagnosis Date  . Depression 02/28/2012  . Fibromyalgia     No past surgical history on file.  Family History  Problem Relation Age of Onset  . Breast cancer Mother 4732  . Breast cancer      Social History History  Substance Use Topics  . Smoking status: Never Smoker   . Smokeless tobacco: Not on file  . Alcohol Use: Yes     Comment: ocassionally    No Known Allergies  Current Outpatient Prescriptions  Medication Sig Dispense Refill  . ALPRAZolam (XANAX) 0.5 MG tablet Take 1 tablet (0.5 mg total) by mouth at bedtime as needed for anxiety.  60 tablet  2  . Oxycodone HCl 10 MG TABS Take 1 tablet (10 mg total) by mouth every 6 (six) hours as needed.  40 tablet  0  . Prenatal Vit-Fe Fumarate-FA (PNV PRENATAL PLUS MULTIVITAMIN) 27-1 MG TABS Take 1 tablet by mouth daily before breakfast.  30 tablet  11  . Ascorbic Acid (VITAMIN C) 1000 MG tablet Take 1,000 mg by mouth daily.      . ergocalciferol (VITAMIN D2) 50000 UNITS capsule Take 50,000 Units by mouth once a week.      . metroNIDAZOLE (METROGEL VAGINAL) 0.75 % vaginal gel Place 1 Applicatorful vaginally 2 (two) times daily.  70 g  0   No current facility-administered medications for this visit.    Review of Systems Review of Systems Constitutional: negative for fatigue and weight loss Respiratory: negative for cough and wheezing Cardiovascular: negative for chest pain, fatigue and palpitations Gastrointestinal: negative for abdominal pain and change in bowel habits Genitourinary:negative Integument/breast: negative for nipple  discharge Musculoskeletal:negative for myalgias Neurological: negative for gait problems and tremors Behavioral/Psych: negative for abusive relationship, depression Endocrine: negative for temperature intolerance   Breasts:  Mass in axilla of right breast  Blood pressure 126/82, pulse 79, temperature 98.5 F (36.9 C), weight 159 lb (72.122 kg), last menstrual period 11/23/2013.  Physical Exam Physical Exam General:   alert and no distress. Breasts:        Right breast:  Soft density felt in axillary tail, nontender, mobile.          Data Reviewed None  Assessment    Breast mass - right breast     Plan  Referred to [facility].    Orders Placed This Encounter  Procedures  . MM Digital Diagnostic Unilat R    Standing Status: Future     Number of Occurrences:      Standing Expiration Date: 02/20/2015    Order Specific Question:  Reason for Exam (SYMPTOM  OR DIAGNOSIS REQUIRED)    Answer:  Axillary mass - right breast    Order Specific Question:  Is the patient pregnant?    Answer:  No    Order Specific Question:  Preferred imaging location?    [facility]   Meds ordered this encounter  Medications  . metroNIDAZOLE (METROGEL VAGINAL) 0.75 % vaginal gel    Sig: Place 1  Applicatorful vaginally 2 (two) times daily.    Dispense:  70 g    Refill:  0  . Oxycodone HCl 10 MG TABS    Sig: Take 1 tablet (10 mg total) by mouth every 6 (six) hours as needed.    Dispense:  40 tablet    Refill:  0        Brock Badharles A Kaislee Chao 12/20/2013, 4:04 PM

## 2013-12-21 ENCOUNTER — Ambulatory Visit
Admission: RE | Admit: 2013-12-21 | Discharge: 2013-12-21 | Disposition: A | Discharge status: Home / Self Care | Payer: BC Managed Care – PPO | Source: Ambulatory Visit | Attending: Obstetrics | Admitting: Obstetrics

## 2013-12-21 ENCOUNTER — Other Ambulatory Visit: Payer: Self-pay | Admitting: Obstetrics

## 2013-12-21 DIAGNOSIS — N63 Unspecified lump in unspecified breast: Secondary | ICD-10-CM

## 2014-01-02 ENCOUNTER — Ambulatory Visit: Payer: BC Managed Care – PPO

## 2014-01-15 ENCOUNTER — Telehealth: Payer: Self-pay | Admitting: *Deleted

## 2014-01-15 NOTE — Telephone Encounter (Signed)
Patient should contact office with request. 

## 2014-01-15 NOTE — Telephone Encounter (Signed)
Pharmacy contact the office on behalf of the patinet who is requesting a refill on Alprazolam 0.5 mg tablet. Patient to take 1 tablet by mouth at bedtime as needed for anxiety.

## 2014-01-17 NOTE — Telephone Encounter (Signed)
Call to patient- not a medication doctor writes for on a long term basis. Patient voiced understanding.

## 2014-03-13 ENCOUNTER — Telehealth: Payer: Self-pay | Admitting: *Deleted

## 2014-03-13 DIAGNOSIS — N390 Urinary tract infection, site not specified: Secondary | ICD-10-CM

## 2014-03-13 MED ORDER — SULFAMETHOXAZOLE-TMP DS 800-160 MG PO TABS: 1.0000 | ORAL_TABLET | Freq: Two times a day (BID) | ORAL | Status: DC

## 2014-03-13 NOTE — Telephone Encounter (Signed)
Patient called - is experiencing frequency and thinks she has a UTI. Call forwarded at 5:43.  6:33 Left message have sent a Rx in for her- may start tonight or may call in am for appointment to check urine.

## 2014-03-14 ENCOUNTER — Encounter (HOSPITAL_COMMUNITY): Payer: Self-pay | Admitting: *Deleted

## 2014-03-14 ENCOUNTER — Inpatient Hospital Stay (HOSPITAL_COMMUNITY)
Admission: AD | Admit: 2014-03-14 | Discharge: 2014-03-14 | Disposition: A | Discharge status: Home / Self Care | Payer: BC Managed Care – PPO | Source: Ambulatory Visit | Attending: Obstetrics | Admitting: Obstetrics

## 2014-03-14 DIAGNOSIS — N390 Urinary tract infection, site not specified: Secondary | ICD-10-CM | POA: Insufficient documentation

## 2014-03-14 DIAGNOSIS — IMO0001 Reserved for inherently not codable concepts without codable children: Secondary | ICD-10-CM | POA: Insufficient documentation

## 2014-03-14 DIAGNOSIS — N921 Excessive and frequent menstruation with irregular cycle: Secondary | ICD-10-CM | POA: Insufficient documentation

## 2014-03-14 DIAGNOSIS — F3289 Other specified depressive episodes: Secondary | ICD-10-CM | POA: Insufficient documentation

## 2014-03-14 DIAGNOSIS — F329 Major depressive disorder, single episode, unspecified: Secondary | ICD-10-CM | POA: Insufficient documentation

## 2014-03-14 DIAGNOSIS — N939 Abnormal uterine and vaginal bleeding, unspecified: Secondary | ICD-10-CM

## 2014-03-14 LAB — URINALYSIS, ROUTINE W REFLEX MICROSCOPIC
Bilirubin Urine: NEGATIVE
Glucose, UA: NEGATIVE mg/dL
Ketones, ur: 15 mg/dL — AB
Leukocytes, UA: NEGATIVE
Nitrite: NEGATIVE
Protein, ur: NEGATIVE mg/dL
Specific Gravity, Urine: 1.025 (ref 1.005–1.030)
UROBILINOGEN UA: 1 mg/dL (ref 0.0–1.0)
pH: 6 (ref 5.0–8.0)

## 2014-03-14 LAB — URINE MICROSCOPIC-ADD ON

## 2014-03-14 LAB — WET PREP, GENITAL
Clue Cells Wet Prep HPF POC: NONE SEEN
Trich, Wet Prep: NONE SEEN
Yeast Wet Prep HPF POC: NONE SEEN

## 2014-03-14 LAB — POCT PREGNANCY, URINE: PREG TEST UR: NEGATIVE

## 2014-03-14 MED ORDER — PHENAZOPYRIDINE HCL 100 MG PO TABS
200.0000 mg | ORAL_TABLET | ORAL | Status: AC
  Administered 2014-03-14: 200 mg via ORAL
  Filled 2014-03-14: qty 2

## 2014-03-14 MED ORDER — CIPROFLOXACIN HCL 500 MG PO TABS
500.0000 mg | ORAL_TABLET | ORAL | Status: AC
  Administered 2014-03-14: 500 mg via ORAL
  Filled 2014-03-14: qty 1

## 2014-03-14 MED ORDER — OXYCODONE-ACETAMINOPHEN 5-325 MG PO TABS
2.0000 | ORAL_TABLET | ORAL | Status: AC
  Administered 2014-03-14: 2 via ORAL
  Filled 2014-03-14: qty 2

## 2014-03-14 MED ORDER — FLUCONAZOLE 150 MG PO TABS: 150.0000 mg | ORAL_TABLET | Freq: Every day | ORAL | Status: DC

## 2014-03-14 MED ORDER — PHENAZOPYRIDINE HCL 200 MG PO TABS: 200.0000 mg | ORAL_TABLET | Freq: Three times a day (TID) | ORAL | Status: DC | PRN

## 2014-03-14 MED ORDER — CIPROFLOXACIN HCL 500 MG PO TABS: 500.0000 mg | ORAL_TABLET | Freq: Two times a day (BID) | ORAL | Status: DC

## 2014-03-14 NOTE — Discharge Instructions (Signed)

## 2014-03-14 NOTE — MAU Note (Signed)
Patient was called in bactrim by dr. Verdell CarmineHarper's office. Pick up yesterday and has been taking. States does not feel better.

## 2014-03-14 NOTE — MAU Note (Signed)
Patient presents to MAU with c/o urinary frequency x 1 week; also reports light pinkish discharge when wipes. Call the after hours line and they advised to come in. Last LMP 7/6. Lower back dull pain 3/10.

## 2014-03-14 NOTE — MAU Provider Note (Signed)
Chief Complaint: Vaginal Bleeding and Urinary Tract Infection   None    SUBJECTIVE HPI: Dana Escobar is a 33 y.o. G2P1011 who presents to maternity admissions reporting urinary frequency and pink bleeding/discharge when she wipes.  She called Dr Verdell CarmineHarper's office yesterday and was started on Bactrim but her symptoms have worsened today.  She reports onset of low bilateral back pain this afternoon. She reports vaginal itching last night so she picked up a 7 day yeast infection topical treatment and used the medication last night. Patient's last menstrual period was 02/18/2014.  Her periods are usually regular.  She denies h/a, dizziness, n/v, or fever/chills.     Past Medical History  Diagnosis Date  . Depression 02/28/2012  . Fibromyalgia    History reviewed. No pertinent past surgical history. History   Social History  . Marital Status: Married    Spouse Name: N/A    Number of Children: N/A  . Years of Education: N/A   Occupational History  . Not on file.   Social History Main Topics  . Smoking status: Never Smoker   . Smokeless tobacco: Not on file  . Alcohol Use: Yes     Comment: ocassionally  . Drug Use: No  . Sexual Activity: Yes    Partners: Male    Birth Control/ Protection: Condom   Other Topics Concern  . Not on file   Social History Narrative  . No narrative on file   No current facility-administered medications on file prior to encounter.   Current Outpatient Prescriptions on File Prior to Encounter  Medication Sig Dispense Refill  . ALPRAZolam (XANAX) 0.5 MG tablet Take 1 tablet (0.5 mg total) by mouth at bedtime as needed for anxiety.  60 tablet  2  . Prenatal Vit-Fe Fumarate-FA (PNV PRENATAL PLUS MULTIVITAMIN) 27-1 MG TABS Take 1 tablet by mouth daily before breakfast.  30 tablet  11  . sulfamethoxazole-trimethoprim (BACTRIM DS) 800-160 MG per tablet Take 1 tablet by mouth 2 (two) times daily.  6 tablet  0   No Known Allergies  ROS: Pertinent items in  HPI  OBJECTIVE Blood pressure 132/94, pulse 125, temperature 97.9 F (36.6 C), temperature source Oral, resp. rate 18, height 5\' 4"  (1.626 m), weight 73.573 kg (162 lb 3.2 oz), last menstrual period 02/18/2014, SpO2 100.00%. GENERAL: Well-developed, well-nourished female in no acute distress.  HEENT: Normocephalic HEART: normal rate RESP: normal effort ABDOMEN: Soft, non-tender Musculoskeletal: Negative CVA tenderness EXTREMITIES: Nontender, no edema NEURO: Alert and oriented Pelvic exam: Cervix pink, visually closed, without lesion, small amount of thick cream mixed with with pink-tinged mucus, vaginal walls and external genitalia normal Bimanual exam: Cervix 0/long/high, firm, anterior, neg CMT, uterus nontender, nonenlarged, adnexa without tenderness, enlargement, or mass  LAB RESULTS Results for orders placed during the hospital encounter of 03/14/14 (from the past 24 hour(s))  URINALYSIS, ROUTINE W REFLEX MICROSCOPIC     Status: Abnormal   Collection Time    03/14/14  8:48 PM      Result Value Ref Range   Color, Urine YELLOW  YELLOW   APPearance CLEAR  CLEAR   Specific Gravity, Urine 1.025  1.005 - 1.030   pH 6.0  5.0 - 8.0   Glucose, UA NEGATIVE  NEGATIVE mg/dL   Hgb urine dipstick SMALL (*) NEGATIVE   Bilirubin Urine NEGATIVE  NEGATIVE   Ketones, ur 15 (*) NEGATIVE mg/dL   Protein, ur NEGATIVE  NEGATIVE mg/dL   Urobilinogen, UA 1.0  0.0 - 1.0 mg/dL  Nitrite NEGATIVE  NEGATIVE   Leukocytes, UA NEGATIVE  NEGATIVE  URINE MICROSCOPIC-ADD ON     Status: Abnormal   Collection Time    03/14/14  8:48 PM      Result Value Ref Range   Squamous Epithelial / LPF MANY (*) RARE   WBC, UA 0-2  <3 WBC/hpf   RBC / HPF 0-2  <3 RBC/hpf   Bacteria, UA RARE  RARE  POCT PREGNANCY, URINE     Status: None   Collection Time    03/14/14  9:00 PM      Result Value Ref Range   Preg Test, Ur NEGATIVE  NEGATIVE    ASSESSMENT 1. UTI (lower urinary tract infection)   2. Vaginal spotting      PLAN Discharge home Wet prep, GCC pending Abx changed to Cipro 500 mg BID x 7 days Pyridium 200 mg Q 6 hours PRN Percocet 5/325 x2 tabs in MAU, no Rx F/U with Dr Clearance Coots or return to MAU if not improved in 24 hours    Medication List    STOP taking these medications       sulfamethoxazole-trimethoprim 800-160 MG per tablet  Commonly known as:  BACTRIM DS      TAKE these medications       acetaminophen 500 MG tablet  Commonly known as:  TYLENOL  Take 1,000 mg by mouth every 6 (six) hours as needed for moderate pain.     ALPRAZolam 0.5 MG tablet  Commonly known as:  XANAX  Take 1 tablet (0.5 mg total) by mouth at bedtime as needed for anxiety.     ciprofloxacin 500 MG tablet  Commonly known as:  CIPRO  Take 1 tablet (500 mg total) by mouth 2 (two) times daily.     Flaxseed Oil 1000 MG Caps  Take 1 capsule by mouth daily.     fluconazole 150 MG tablet  Commonly known as:  DIFLUCAN  Take 1 tablet (150 mg total) by mouth daily.     Lysine 500 MG Caps  Take 1 capsule by mouth daily.     multivitamin capsule  Take 1 capsule by mouth daily.     phenazopyridine 200 MG tablet  Commonly known as:  PYRIDIUM  Take 1 tablet (200 mg total) by mouth 3 (three) times daily as needed for pain.     PNV PRENATAL PLUS MULTIVITAMIN 27-1 MG Tabs  Take 1 tablet by mouth daily before breakfast.       Follow-up Information   Follow up with HARPER,CHARLES A, MD. (Or return to MAU on the weekend if symptoms worsen)    Specialty:  Obstetrics and Gynecology   Contact information:   [address] [place] [address] [phone]       Sharen Counter Certified Nurse-Midwife 03/14/2014  10:59 PM

## 2014-03-15 LAB — GC/CHLAMYDIA PROBE AMP
CT PROBE, AMP APTIMA: NEGATIVE
GC Probe RNA: NEGATIVE

## 2014-03-16 LAB — URINE CULTURE
Colony Count: NO GROWTH
Culture: NO GROWTH

## 2014-03-25 ENCOUNTER — Ambulatory Visit: Payer: BC Managed Care – PPO | Admitting: Obstetrics

## 2014-03-25 ENCOUNTER — Ambulatory Visit (INDEPENDENT_AMBULATORY_CARE_PROVIDER_SITE_OTHER): Payer: BC Managed Care – PPO | Admitting: Obstetrics

## 2014-03-25 ENCOUNTER — Encounter: Payer: Self-pay | Admitting: Obstetrics

## 2014-03-25 ENCOUNTER — Telehealth: Payer: Self-pay

## 2014-03-25 VITALS — BP 125/83 | HR 96 | Temp 97.9°F | Ht 64.0 in | Wt 163.0 lb

## 2014-03-25 DIAGNOSIS — N318 Other neuromuscular dysfunction of bladder: Secondary | ICD-10-CM

## 2014-03-25 DIAGNOSIS — N3281 Overactive bladder: Secondary | ICD-10-CM

## 2014-03-25 DIAGNOSIS — N760 Acute vaginitis: Secondary | ICD-10-CM

## 2014-03-25 DIAGNOSIS — N946 Dysmenorrhea, unspecified: Secondary | ICD-10-CM

## 2014-03-25 MED ORDER — OXYCODONE HCL 10 MG PO TABS: 10.0000 mg | ORAL_TABLET | Freq: Four times a day (QID) | ORAL | Status: DC | PRN

## 2014-03-25 MED ORDER — METRONIDAZOLE 0.75 % VA GEL: 1.0000 | Freq: Two times a day (BID) | VAGINAL | Status: DC

## 2014-03-25 NOTE — Progress Notes (Signed)
Patient ID: Dana Escobar, female   DOB: Dec 14, 1980, 33 y.o.   MRN: [id number]  Chief Complaint  Patient presents with  . Follow-up    [facility]     HPI Dana Escobar is a 33 y.o. female.  C/O urinary frequency.  HPI  Past Medical History  Diagnosis Date  . Depression 02/28/2012  . Fibromyalgia     History reviewed. No pertinent past surgical history.  Family History  Problem Relation Age of Onset  . Breast cancer Mother 5332  . Breast cancer      Social History History  Substance Use Topics  . Smoking status: Never Smoker   . Smokeless tobacco: Never Used  . Alcohol Use: Yes     Comment: ocassionally    No Known Allergies  Current Outpatient Prescriptions  Medication Sig Dispense Refill  . acetaminophen (TYLENOL) 500 MG tablet Take 1,000 mg by mouth every 6 (six) hours as needed for moderate pain.      Marland Kitchen. ALPRAZolam (XANAX) 0.5 MG tablet Take 1 tablet (0.5 mg total) by mouth at bedtime as needed for anxiety.  60 tablet  2  . Flaxseed, Linseed, (FLAXSEED OIL) 1000 MG CAPS Take 1 capsule by mouth daily.      Marland Kitchen. Lysine 500 MG CAPS Take 1 capsule by mouth daily.      . Multiple Vitamin (MULTIVITAMIN) capsule Take 1 capsule by mouth daily.      . Prenatal Vit-Fe Fumarate-FA (PNV PRENATAL PLUS MULTIVITAMIN) 27-1 MG TABS Take 1 tablet by mouth daily before breakfast.  30 tablet  11  . metroNIDAZOLE (METROGEL VAGINAL) 0.75 % vaginal gel Place 1 Applicatorful vaginally 2 (two) times daily.  70 g  0  . Oxycodone HCl 10 MG TABS Take 1 tablet (10 mg total) by mouth every 6 (six) hours as needed.  40 tablet  0   No current facility-administered medications for this visit.    Review of Systems Review of Systems Constitutional: negative for fatigue and weight loss Respiratory: negative for cough and wheezing Cardiovascular: negative for chest pain, fatigue and palpitations Gastrointestinal: negative for abdominal pain and change in bowel habits Genitourinary:  Urinary  frequency Integument/breast: negative for nipple discharge Musculoskeletal:negative for myalgias Neurological: negative for gait problems and tremors Behavioral/Psych: negative for abusive relationship.  Positive for depression / anxiety.   Endocrine: negative for temperature intolerance     Blood pressure 125/83, pulse 96, temperature 97.9 F (36.6 C), height 5\' 4"  (1.626 m), weight 163 lb (73.936 kg), last menstrual period 03/17/2014.  Physical Exam Physical Exam:  Deferred  100% of 10 min visit spent on counseling and coordination of care.   Data Reviewed Labs  Assessment    OAB     Plan    Urine culture sent. Referred to Urology for OAB.  Orders Placed This Encounter  Procedures  . Ambulatory referral to Urology    Referral Priority:  Routine    Referral Type:  Consultation    Referral Reason:  Specialty Services Required    Requested Specialty:  Urology    Number of Visits Requested:  1   Meds ordered this encounter  Medications  . Oxycodone HCl 10 MG TABS    Sig: Take 1 tablet (10 mg total) by mouth every 6 (six) hours as needed.    Dispense:  40 tablet    Refill:  0  . metroNIDAZOLE (METROGEL VAGINAL) 0.75 % vaginal gel    Sig: Place 1 Applicatorful vaginally 2 (two) times  daily.    Dispense:  70 g    Refill:  0        Jhamal Plucinski A 03/25/2014, 12:46 PM

## 2014-03-25 NOTE — Addendum Note (Signed)
Addended by: Marya LandryFOSTER, Ladarrion Telfair D on: 03/25/2014 02:21 PM   Modules accepted: Orders

## 2014-03-25 NOTE — Telephone Encounter (Signed)
Spoke with patient regarding appt with [facility] - 8/17 at 1:30pm

## 2014-03-26 LAB — URINE CULTURE
Colony Count: NO GROWTH
Organism ID, Bacteria: NO GROWTH

## 2014-04-10 ENCOUNTER — Other Ambulatory Visit: Payer: Self-pay | Admitting: Internal Medicine

## 2014-04-10 ENCOUNTER — Ambulatory Visit
Admission: RE | Admit: 2014-04-10 | Discharge: 2014-04-10 | Disposition: A | Discharge status: Home / Self Care | Payer: BC Managed Care – PPO | Source: Ambulatory Visit | Attending: Internal Medicine | Admitting: Internal Medicine

## 2014-04-10 DIAGNOSIS — R079 Chest pain, unspecified: Secondary | ICD-10-CM

## 2014-04-10 DIAGNOSIS — R7989 Other specified abnormal findings of blood chemistry: Secondary | ICD-10-CM

## 2014-04-10 MED ORDER — IOHEXOL 350 MG/ML SOLN
100.0000 mL | Freq: Once | INTRAVENOUS | Status: AC | PRN
  Administered 2014-04-10: 100 mL via INTRAVENOUS

## 2014-04-16 ENCOUNTER — Other Ambulatory Visit: Payer: Self-pay | Admitting: Internal Medicine

## 2014-04-16 DIAGNOSIS — R0789 Other chest pain: Secondary | ICD-10-CM

## 2014-05-06 ENCOUNTER — Ambulatory Visit (INDEPENDENT_AMBULATORY_CARE_PROVIDER_SITE_OTHER): Payer: BC Managed Care – PPO | Admitting: Obstetrics

## 2014-05-06 ENCOUNTER — Encounter: Payer: Self-pay | Admitting: Obstetrics

## 2014-05-06 VITALS — BP 128/82 | HR 106 | Temp 98.4°F | Wt 162.0 lb

## 2014-05-06 DIAGNOSIS — N63 Unspecified lump in unspecified breast: Secondary | ICD-10-CM

## 2014-05-06 NOTE — Progress Notes (Signed)
Patient ID: Dana Escobar, female   DOB: 1981/03/30, 33 y.o.   MRN: [id number]  Chief Complaint  Patient presents with  . Breast Problem    HPI Dana Escobar is a 33 y.o. female.  Masses felt in both breasts.  HPI  Past Medical History  Diagnosis Date  . Depression 02/28/2012  . Fibromyalgia     History reviewed. No pertinent past surgical history.  Family History  Problem Relation Age of Onset  . Breast cancer Mother 49  . Breast cancer      Social History History  Substance Use Topics  . Smoking status: Never Smoker   . Smokeless tobacco: Never Used  . Alcohol Use: Yes     Comment: ocassionally    No Known Allergies  Current Outpatient Prescriptions  Medication Sig Dispense Refill  . acetaminophen (TYLENOL) 500 MG tablet Take 1,000 mg by mouth every 6 (six) hours as needed for moderate pain.      Marland Kitchen ALPRAZolam (XANAX) 0.5 MG tablet Take 1 tablet (0.5 mg total) by mouth at bedtime as needed for anxiety.  60 tablet  2  . Lysine 500 MG CAPS Take 1 capsule by mouth daily.      . Multiple Vitamin (MULTIVITAMIN) capsule Take 1 capsule by mouth daily.      . Oxycodone HCl 10 MG TABS Take 1 tablet (10 mg total) by mouth every 6 (six) hours as needed.  40 tablet  0  . Flaxseed, Linseed, (FLAXSEED OIL) 1000 MG CAPS Take 1 capsule by mouth daily.      . Prenatal Vit-Fe Fumarate-FA (PNV PRENATAL PLUS MULTIVITAMIN) 27-1 MG TABS Take 1 tablet by mouth daily before breakfast.  30 tablet  11   No current facility-administered medications for this visit.    Review of Systems Review of Systems Constitutional: negative for fatigue and weight loss Respiratory: negative for cough and wheezing Cardiovascular: negative for chest pain, fatigue and palpitations Gastrointestinal: negative for abdominal pain and change in bowel habits Genitourinary:negative Integument/breast: negative for nipple discharge Musculoskeletal:negative for myalgias Neurological: negative for gait problems  and tremors Behavioral/Psych: negative for abusive relationship, depression Endocrine: negative for temperature intolerance     Blood pressure 128/82, pulse 106, temperature 98.4 F (36.9 C), weight 162 lb (73.483 kg), last menstrual period 04/16/2014.  Physical Exam Physical Exam General:   alert  Skin:   no rash or abnormalities  Lungs:   not evaluated  Heart:   not evaluated  Breasts:   normal without suspicious masses, skin or nipple changes or axillary nodes  Abdomen:  not evaluated  Pelvis:  not evaluated      Data Reviewed [facility]  Assessment    Breast mass.   Plan  Referred to [facility].    Orders Placed This Encounter  Procedures  . MM Digital Diagnostic Bilat    Standing Status: Future     Number of Occurrences:      Standing Expiration Date: 07/07/2015    Order Specific Question:  Reason for Exam (SYMPTOM  OR DIAGNOSIS REQUIRED)    Answer:  Increased density, bilateral.    Order Specific Question:  Is the patient pregnant?    Answer:  No    Order Specific Question:  Preferred imaging location?    Answer:  [facility]   No orders of the defined types were placed in this encounter.        Dana Escobar A 05/06/2014, 5:46 PM

## 2014-05-08 ENCOUNTER — Emergency Department (HOSPITAL_COMMUNITY): Payer: BC Managed Care – PPO

## 2014-05-08 ENCOUNTER — Encounter (HOSPITAL_COMMUNITY): Payer: Self-pay | Admitting: Emergency Medicine

## 2014-05-08 ENCOUNTER — Emergency Department (HOSPITAL_COMMUNITY)
Admission: EM | Admit: 2014-05-08 | Discharge: 2014-05-08 | Disposition: A | Discharge status: Home / Self Care | Payer: BC Managed Care – PPO | Attending: Emergency Medicine | Admitting: Emergency Medicine

## 2014-05-08 DIAGNOSIS — S199XXA Unspecified injury of neck, initial encounter: Secondary | ICD-10-CM

## 2014-05-08 DIAGNOSIS — F3289 Other specified depressive episodes: Secondary | ICD-10-CM | POA: Diagnosis not present

## 2014-05-08 DIAGNOSIS — S20219A Contusion of unspecified front wall of thorax, initial encounter: Secondary | ICD-10-CM | POA: Insufficient documentation

## 2014-05-08 DIAGNOSIS — Y9389 Activity, other specified: Secondary | ICD-10-CM | POA: Diagnosis not present

## 2014-05-08 DIAGNOSIS — Z79899 Other long term (current) drug therapy: Secondary | ICD-10-CM | POA: Insufficient documentation

## 2014-05-08 DIAGNOSIS — S139XXA Sprain of joints and ligaments of unspecified parts of neck, initial encounter: Secondary | ICD-10-CM | POA: Diagnosis not present

## 2014-05-08 DIAGNOSIS — S0993XA Unspecified injury of face, initial encounter: Secondary | ICD-10-CM | POA: Diagnosis present

## 2014-05-08 DIAGNOSIS — Y92410 Unspecified street and highway as the place of occurrence of the external cause: Secondary | ICD-10-CM | POA: Insufficient documentation

## 2014-05-08 DIAGNOSIS — S29019A Strain of muscle and tendon of unspecified wall of thorax, initial encounter: Secondary | ICD-10-CM

## 2014-05-08 DIAGNOSIS — F329 Major depressive disorder, single episode, unspecified: Secondary | ICD-10-CM | POA: Insufficient documentation

## 2014-05-08 DIAGNOSIS — S161XXA Strain of muscle, fascia and tendon at neck level, initial encounter: Secondary | ICD-10-CM

## 2014-05-08 MED ORDER — HYDROCODONE-ACETAMINOPHEN 5-325 MG PO TABS
1.0000 | ORAL_TABLET | Freq: Once | ORAL | Status: AC
  Administered 2014-05-08: 1 via ORAL
  Filled 2014-05-08: qty 1

## 2014-05-08 MED ORDER — HYDROCODONE-ACETAMINOPHEN 5-325 MG PO TABS: 1.0000 | ORAL_TABLET | ORAL | Status: DC | PRN

## 2014-05-08 MED ORDER — NAPROXEN 500 MG PO TABS: 500.0000 mg | ORAL_TABLET | Freq: Two times a day (BID) | ORAL | Status: DC
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
## 2014-05-08 NOTE — Discharge Instructions (Signed)
Naprosyn for pain and inflammation. Norco for severe pain. Heating pads and ice. Follow up with primary care doctor if not improving in 3-5 days.    Back Pain, Adult Low back pain is very common. About 1 in 5 people have back pain.The cause of low back pain is rarely dangerous. The pain often gets better over time.About half of people with a sudden onset of back pain feel better in just 2 weeks. About 8 in 10 people feel better by 6 weeks.  CAUSES Some common causes of back pain include:  Strain of the muscles or ligaments supporting the spine.  Wear and tear (degeneration) of the spinal discs.  Arthritis.  Direct injury to the back. DIAGNOSIS Most of the time, the direct cause of low back pain is not known.However, back pain can be treated effectively even when the exact cause of the pain is unknown.Answering your caregiver's questions about your overall health and symptoms is one of the most accurate ways to make sure the cause of your pain is not dangerous. If your caregiver needs more information, he or she may order lab work or imaging tests (X-rays or MRIs).However, even if imaging tests show changes in your back, this usually does not require surgery. HOME CARE INSTRUCTIONS For many people, back pain returns.Since low back pain is rarely dangerous, it is often a condition that people can learn to [facility] their own.   Remain active. It is stressful on the back to sit or stand in one place. Do not sit, drive, or stand in one place for more than 30 minutes at a time. Take short walks on level surfaces as soon as pain allows.Try to increase the length of time you walk each day.  Do not stay in bed.Resting more than 1 or 2 days can delay your recovery.  Do not avoid exercise or work.Your body is made to move.It is not dangerous to be active, even though your back may hurt.Your back will likely heal faster if you return to being active before your pain is gone.  Pay attention  to your body when you bend and lift. Many people have less discomfortwhen lifting if they bend their knees, keep the load close to their bodies,and avoid twisting. Often, the most comfortable positions are those that put less stress on your recovering back.  Find a comfortable position to sleep. Use a firm mattress and lie on your side with your knees slightly bent. If you lie on your back, put a pillow under your knees.  Only take over-the-counter or prescription medicines as directed by your caregiver. Over-the-counter medicines to reduce pain and inflammation are often the most helpful.Your caregiver may prescribe muscle relaxant drugs.These medicines help dull your pain so you can more quickly return to your normal activities and healthy exercise.  Put ice on the injured area.  Put ice in a plastic bag.  Place a towel between your skin and the bag.  Leave the ice on for 15-20 minutes, 03-04 times a day for the first 2 to 3 days. After that, ice and heat may be alternated to reduce pain and spasms.  Ask your caregiver about trying back exercises and gentle massage. This may be of some benefit.  Avoid feeling anxious or stressed.Stress increases muscle tension and can worsen back pain.It is important to recognize when you are anxious or stressed and learn ways to manage it.Exercise is a great option. SEEK MEDICAL CARE IF:  You have pain that is not relieved  with rest or medicine.  You have pain that does not improve in 1 week.  You have new symptoms.  You are generally not feeling well. SEEK IMMEDIATE MEDICAL CARE IF:   You have pain that radiates from your back into your legs.  You develop new bowel or bladder control problems.  You have unusual weakness or numbness in your arms or legs.  You develop nausea or vomiting.  You develop abdominal pain.  You feel faint. Document Released: 08/02/2005 Document Revised: 02/01/2012 Document Reviewed: 12/04/2013 [facility]  Patient Information 2015 [place], [place]. This information is not intended to replace advice given to you by your health care provider. Make sure you discuss any questions you have with your health care provider.
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
## 2014-05-10 ENCOUNTER — Other Ambulatory Visit: Payer: Self-pay | Admitting: Obstetrics

## 2014-05-10 DIAGNOSIS — N63 Unspecified lump in unspecified breast: Secondary | ICD-10-CM

## 2014-05-10 NOTE — ED Provider Notes (Signed)
Medical screening examination/treatment/procedure(s) were performed by non-physician practitioner and as supervising physician I was immediately available for consultation/collaboration.   EKG Interpretation None      Gurtha Picker, MD, FACEP   Ellenie Salome L Yves Fodor, MD 05/10/14 0030 

## 2014-05-21 ENCOUNTER — Ambulatory Visit
Admission: RE | Admit: 2014-05-21 | Discharge: 2014-05-21 | Disposition: A | Discharge status: Home / Self Care | Payer: BC Managed Care – PPO | Source: Ambulatory Visit | Attending: Obstetrics | Admitting: Obstetrics

## 2014-05-21 ENCOUNTER — Encounter (INDEPENDENT_AMBULATORY_CARE_PROVIDER_SITE_OTHER): Payer: Self-pay

## 2014-05-21 DIAGNOSIS — N63 Unspecified lump in unspecified breast: Secondary | ICD-10-CM

## 2014-06-17 ENCOUNTER — Encounter (HOSPITAL_COMMUNITY): Payer: Self-pay | Admitting: Emergency Medicine

## 2014-06-23 ENCOUNTER — Encounter: Payer: Self-pay | Admitting: *Deleted

## 2014-07-09 ENCOUNTER — Other Ambulatory Visit: Payer: Self-pay | Admitting: Obstetrics

## 2014-07-09 ENCOUNTER — Encounter: Payer: Self-pay | Admitting: Obstetrics

## 2014-07-09 ENCOUNTER — Ambulatory Visit (INDEPENDENT_AMBULATORY_CARE_PROVIDER_SITE_OTHER): Payer: BC Managed Care – PPO | Admitting: Obstetrics

## 2014-07-09 VITALS — BP 130/89 | HR 91 | Temp 98.9°F | Ht 64.0 in | Wt 161.0 lb

## 2014-07-09 DIAGNOSIS — F418 Other specified anxiety disorders: Secondary | ICD-10-CM

## 2014-07-09 DIAGNOSIS — A499 Bacterial infection, unspecified: Secondary | ICD-10-CM

## 2014-07-09 DIAGNOSIS — N760 Acute vaginitis: Secondary | ICD-10-CM

## 2014-07-09 DIAGNOSIS — B9689 Other specified bacterial agents as the cause of diseases classified elsewhere: Secondary | ICD-10-CM

## 2014-07-09 DIAGNOSIS — N946 Dysmenorrhea, unspecified: Secondary | ICD-10-CM

## 2014-07-09 DIAGNOSIS — Z01419 Encounter for gynecological examination (general) (routine) without abnormal findings: Secondary | ICD-10-CM

## 2014-07-09 DIAGNOSIS — Z124 Encounter for screening for malignant neoplasm of cervix: Secondary | ICD-10-CM

## 2014-07-09 LAB — COMPREHENSIVE METABOLIC PANEL
ALT: 12 U/L (ref 0–35)
AST: 18 U/L (ref 0–37)
Albumin: 4.5 g/dL (ref 3.5–5.2)
Alkaline Phosphatase: 46 U/L (ref 39–117)
BUN: 13 mg/dL (ref 6–23)
CALCIUM: 9.3 mg/dL (ref 8.4–10.5)
CHLORIDE: 101 meq/L (ref 96–112)
CO2: 22 meq/L (ref 19–32)
CREATININE: 0.8 mg/dL (ref 0.50–1.10)
Glucose, Bld: 81 mg/dL (ref 70–99)
Potassium: 4.1 mEq/L (ref 3.5–5.3)
Sodium: 133 mEq/L — ABNORMAL LOW (ref 135–145)
Total Bilirubin: 0.4 mg/dL (ref 0.2–1.2)
Total Protein: 7.7 g/dL (ref 6.0–8.3)

## 2014-07-09 LAB — CBC WITH DIFFERENTIAL/PLATELET
Basophils Absolute: 0 10*3/uL (ref 0.0–0.1)
Basophils Relative: 0 % (ref 0–1)
Eosinophils Absolute: 0.1 10*3/uL (ref 0.0–0.7)
Eosinophils Relative: 1 % (ref 0–5)
HCT: 35.5 % — ABNORMAL LOW (ref 36.0–46.0)
Hemoglobin: 12.1 g/dL (ref 12.0–15.0)
LYMPHS ABS: 2.1 10*3/uL (ref 0.7–4.0)
Lymphocytes Relative: 41 % (ref 12–46)
MCH: 29.2 pg (ref 26.0–34.0)
MCHC: 34.1 g/dL (ref 30.0–36.0)
MCV: 85.5 fL (ref 78.0–100.0)
MPV: 9.4 fL (ref 9.4–12.4)
Monocytes Absolute: 0.5 10*3/uL (ref 0.1–1.0)
Monocytes Relative: 9 % (ref 3–12)
Neutro Abs: 2.5 10*3/uL (ref 1.7–7.7)
Neutrophils Relative %: 49 % (ref 43–77)
Platelets: 364 10*3/uL (ref 150–400)
RBC: 4.15 MIL/uL (ref 3.87–5.11)
RDW: 13.7 % (ref 11.5–15.5)
WBC: 5 10*3/uL (ref 4.0–10.5)

## 2014-07-09 LAB — TSH: TSH: 1.329 u[IU]/mL (ref 0.350–4.500)

## 2014-07-09 MED ORDER — TRAMADOL HCL 50 MG PO TABS: 50.0000 mg | ORAL_TABLET | Freq: Four times a day (QID) | ORAL | Status: DC | PRN

## 2014-07-09 MED ORDER — METRONIDAZOLE 0.75 % VA GEL: 1.0000 | Freq: Two times a day (BID) | VAGINAL | Status: DC

## 2014-07-09 NOTE — Progress Notes (Signed)
Subjective:     Dana I Myrtie HawkKulii is a 33 y.o. female here for a routine exam.  Current complaints: malodorous vaginal discharge.    Personal health questionnaire:  Is patient [name], have a family history of breast and/or ovarian cancer: no Is there a family history of uterine cancer diagnosed at age < 4350, gastrointestinal cancer, urinary tract cancer, family member who is a Personnel officerLynch syndrome-associated carrier: no Is the patient overweight and hypertensive, family history of diabetes, personal history of gestational diabetes or PCOS: no Is patient over 3455, have PCOS,  family history of premature CHD under age 33, diabetes, smoke, have hypertension or peripheral artery disease:  no At any time, has a partner hit, kicked or otherwise hurt or frightened you?: no Over the past 2 weeks, have you felt down, depressed or hopeless?: no Over the past 2 weeks, have you felt little interest or pleasure in doing things?:no   Gynecologic History Patient's last menstrual period was 06/13/2014. Contraception: condoms Last Pap: 2014. Results were: normal Last mammogram: 2014 . Results were: normal  Obstetric History OB History  Gravida Para Term Preterm AB SAB TAB Ectopic Multiple Living  2 1 1  1  1   1     # Outcome Date GA Lbr Len/2nd Weight Sex Delivery Anes PTL Lv  2 TAB           1 Term      Vag-Spont   Y      Past Medical History  Diagnosis Date  . Depression 02/28/2012  . Fibromyalgia     History reviewed. No pertinent past surgical history.  Current outpatient prescriptions: acetaminophen (TYLENOL) 500 MG tablet, Take 1,000 mg by mouth every 6 (six) hours as needed for moderate pain., Disp: , Rfl: ;  ALPRAZolam (XANAX) 0.5 MG tablet, Take 1 tablet (0.5 mg total) by mouth at bedtime as needed for anxiety., Disp: 60 tablet, Rfl: 2;  Lysine 500 MG CAPS, Take 1 capsule by mouth daily., Disp: , Rfl: ;  Multiple Vitamin (MULTIVITAMIN) capsule, Take 1 capsule by mouth daily., Disp: , Rfl:   metroNIDAZOLE (METROGEL VAGINAL) 0.75 % vaginal gel, Place 1 Applicatorful vaginally 2 (two) times daily., Disp: 70 g, Rfl: 4;  traMADol (ULTRAM) 50 MG tablet, Take 1 tablet (50 mg total) by mouth every 6 (six) hours as needed for moderate pain., Disp: 40 tablet, Rfl: 4 No Known Allergies  History  Substance Use Topics  . Smoking status: Never Smoker   . Smokeless tobacco: Never Used  . Alcohol Use: Yes     Comment: ocassionally    Family History  Problem Relation Age of Onset  . Breast cancer Mother 4032  . Breast cancer        Review of Systems  Constitutional: negative for fatigue and weight loss Respiratory: negative for cough and wheezing Cardiovascular: negative for chest pain, fatigue and palpitations Gastrointestinal: negative for abdominal pain and change in bowel habits Musculoskeletal:negative for myalgias Neurological: negative for gait problems and tremors Behavioral/Psych: negative for abusive relationship.  Positive for depression and anxiety Endocrine: negative for temperature intolerance   Genitourinary:negative for abnormal menstrual periods, genital lesions, hot flashes, sexual problems.   Positive for vaginal discharge Integument/breast: negative for breast lump, breast tenderness, nipple discharge and skin lesion(s)    Objective:       BP 130/89 mmHg  Pulse 91  Temp(Src) 98.9 F (37.2 C)  Ht 5\' 4"  (1.626 m)  Wt 161 lb (73.029 kg)  BMI 27.62 kg/m2  LMP 06/13/2014 General:   alert  Skin:   no rash or abnormalities  Lungs:   clear to auscultation bilaterally  Heart:   regular rate and rhythm, S1, S2 normal, no murmur, click, rub or gallop  Breasts:   normal without suspicious masses, skin or nipple changes or axillary nodes  Abdomen:  normal findings: no organomegaly, soft, non-tender and no hernia  Pelvis:  External genitalia: normal general appearance Urinary system: urethral meatus normal and bladder without fullness, nontender Vaginal: normal  without tenderness, induration or masses Cervix: normal appearance Adnexa: normal bimanual exam Uterus: anteverted and non-tender, normal size   Lab Review Urine pregnancy test Labs reviewed yes Radiologic studies reviewed yes    Assessment:    Healthy female exam.    BV  Dysmenorrhea  Depression / Anxiety   Plan:   Labs:  General Health Panel and STD Panel MetroGel Rx Tramadol Rx   Education reviewed: calcium supplements, low fat, low cholesterol diet, safe sex/STD prevention and self breast exams. Contraception: condoms. Follow up in: 1 year.   Meds ordered this encounter  Medications  . metroNIDAZOLE (METROGEL VAGINAL) 0.75 % vaginal gel    Sig: Place 1 Applicatorful vaginally 2 (two) times daily.    Dispense:  70 g    Refill:  4  . traMADol (ULTRAM) 50 MG tablet    Sig: Take 1 tablet (50 mg total) by mouth every 6 (six) hours as needed for moderate pain.    Dispense:  40 tablet    Refill:  4   Orders Placed This Encounter  Procedures  . WET PREP BY MOLECULAR PROBE  . GC/Chlamydia Probe Amp  . HIV antibody  . Hepatitis B surface antigen  . RPR  . Hepatitis C antibody  . CBC with Differential  . Comprehensive metabolic panel  . TSH

## 2014-07-10 LAB — PAP IG AND HPV HIGH-RISK: HPV DNA High Risk: NOT DETECTED

## 2014-07-10 LAB — WET PREP BY MOLECULAR PROBE
Candida species: NEGATIVE
Gardnerella vaginalis: POSITIVE — AB
Trichomonas vaginosis: NEGATIVE

## 2014-07-10 LAB — GC/CHLAMYDIA PROBE AMP
CT Probe RNA: NEGATIVE
GC Probe RNA: NEGATIVE

## 2014-07-10 LAB — HEPATITIS C ANTIBODY: HCV Ab: NEGATIVE

## 2014-07-10 LAB — SYPHILIS: RPR W/REFLEX TO RPR TITER AND TREPONEMAL ANTIBODIES, TRADITIONAL SCREENING AND DIAGNOSIS ALGORITHM

## 2014-07-10 LAB — HEPATITIS B SURFACE ANTIGEN: Hepatitis B Surface Ag: NEGATIVE

## 2014-07-10 LAB — HIV ANTIBODY (ROUTINE TESTING W REFLEX): HIV: NONREACTIVE

## 2014-07-12 ENCOUNTER — Telehealth: Payer: Self-pay | Admitting: *Deleted

## 2014-07-12 NOTE — Telephone Encounter (Signed)
Patient called regarding lab results. Patient notified everything was normal but we are still waiting on her Wet Prep to come back and will notify her if it is abnormal. Patient voiced understanding.

## 2014-07-16 ENCOUNTER — Other Ambulatory Visit: Payer: Self-pay | Admitting: Obstetrics

## 2015-05-02 ENCOUNTER — Other Ambulatory Visit: Payer: Self-pay

## 2015-05-02 ENCOUNTER — Other Ambulatory Visit: Payer: Self-pay | Admitting: Obstetrics

## 2015-05-02 DIAGNOSIS — Z803 Family history of malignant neoplasm of breast: Secondary | ICD-10-CM

## 2015-05-02 DIAGNOSIS — Z1231 Encounter for screening mammogram for malignant neoplasm of breast: Secondary | ICD-10-CM

## 2015-05-06 ENCOUNTER — Ambulatory Visit (INDEPENDENT_AMBULATORY_CARE_PROVIDER_SITE_OTHER): Payer: BLUE CROSS/BLUE SHIELD | Admitting: Obstetrics

## 2015-05-06 VITALS — BP 118/79 | HR 85 | Wt 162.0 lb

## 2015-05-06 DIAGNOSIS — Z113 Encounter for screening for infections with a predominantly sexual mode of transmission: Secondary | ICD-10-CM | POA: Diagnosis not present

## 2015-05-06 DIAGNOSIS — N760 Acute vaginitis: Secondary | ICD-10-CM | POA: Diagnosis not present

## 2015-05-06 DIAGNOSIS — B9689 Other specified bacterial agents as the cause of diseases classified elsewhere: Secondary | ICD-10-CM

## 2015-05-06 DIAGNOSIS — N898 Other specified noninflammatory disorders of vagina: Secondary | ICD-10-CM

## 2015-05-06 DIAGNOSIS — A499 Bacterial infection, unspecified: Secondary | ICD-10-CM

## 2015-05-06 MED ORDER — TINIDAZOLE 500 MG PO TABS: 1000.0000 mg | ORAL_TABLET | Freq: Every day | ORAL | Status: DC

## 2015-05-06 NOTE — Progress Notes (Addendum)
Patient ID: Dana Escobar, female   DOB: 11-12-1980, 34 y.o.   MRN: [id number]  Chief Complaint  Patient presents with  . Vaginitis    HPI Dana Escobar is a 34 y.o. female.  Malodorous vaginal discharge.  Denies vaginal itching, but does feel irritation.  Has H/O chronic BV.  Just finished a course of MetroGel 2 days ago. HPI  Past Medical History  Diagnosis Date  . Depression 02/28/2012  . Fibromyalgia     No past surgical history on file.  Family History  Problem Relation Age of Onset  . Breast cancer Mother 13  . Breast cancer      Social History Social History  Substance Use Topics  . Smoking status: Never Smoker   . Smokeless tobacco: Never Used  . Alcohol Use: Yes     Comment: ocassionally    No Known Allergies  Current Outpatient Prescriptions  Medication Sig Dispense Refill  . acetaminophen (TYLENOL) 500 MG tablet Take 1,000 mg by mouth every 6 (six) hours as needed for moderate pain.    Marland Kitchen ALPRAZolam (XANAX) 0.5 MG tablet Take 1 tablet (0.5 mg total) by mouth at bedtime as needed for anxiety. 60 tablet 2  . Lysine 500 MG CAPS Take 1 capsule by mouth daily.    . metroNIDAZOLE (METROGEL VAGINAL) 0.75 % vaginal gel Place 1 Applicatorful vaginally 2 (two) times daily. 70 g 4  . traMADol (ULTRAM) 50 MG tablet Take 1 tablet (50 mg total) by mouth every 6 (six) hours as needed for moderate pain. 40 tablet 4  . Multiple Vitamin (MULTIVITAMIN) capsule Take 1 capsule by mouth daily.    Marland Kitchen tinidazole (TINDAMAX) 500 MG tablet Take 2 tablets (1,000 mg total) by mouth daily with breakfast. 10 tablet 4   No current facility-administered medications for this visit.    Review of Systems Review of Systems Constitutional: negative for fatigue and weight loss Respiratory: negative for cough and wheezing Cardiovascular: negative for chest pain, fatigue and palpitations Gastrointestinal: negative for abdominal pain and change in bowel habits Genitourinary: malodorous vaginal  discharge Integument/breast: negative for nipple discharge Musculoskeletal:negative for myalgias Neurological: negative for gait problems and tremors Behavioral/Psych: negative for abusive relationship, depression Endocrine: negative for temperature intolerance     Blood pressure 118/79, pulse 85, weight 162 lb (73.483 kg), last menstrual period 04/28/2015.  Physical Exam Physical Exam           General:  Alert and no distress Abdomen:  normal findings: no organomegaly, soft, non-tender and no hernia  Pelvis:  External genitalia: normal general appearance Urinary system: urethral meatus normal and bladder without fullness, nontender Vaginal: normal without tenderness, induration or masses Cervix: normal appearance Adnexa: normal bimanual exam Uterus: anteverted and non-tender, normal size      Data Reviewed Labs  Assessment     BV, recurrent     Plan    Tindamax Rx F/U in 2-3 months   Orders Placed This Encounter  Procedures  . GC/Chlamydia Probe Amp  . SureSwab Bacterial Vaginosis/itis  . HIV antibody  . Hepatitis B surface antigen  . RPR  . Hepatitis C antibody   Meds ordered this encounter  Medications  . tinidazole (TINDAMAX) 500 MG tablet    Sig: Take 2 tablets (1,000 mg total) by mouth daily with breakfast.    Dispense:  10 tablet    Refill:  4

## 2015-05-07 ENCOUNTER — Telehealth: Payer: Self-pay | Admitting: *Deleted

## 2015-05-07 LAB — GC/CHLAMYDIA PROBE AMP
CT Probe RNA: NEGATIVE
GC PROBE AMP APTIMA: NEGATIVE

## 2015-05-07 LAB — HEPATITIS C ANTIBODY: HCV AB: NEGATIVE

## 2015-05-07 LAB — RPR

## 2015-05-07 LAB — HIV ANTIBODY (ROUTINE TESTING W REFLEX): HIV 1&2 Ab, 4th Generation: NONREACTIVE

## 2015-05-07 LAB — HEPATITIS B SURFACE ANTIGEN: Hepatitis B Surface Ag: NEGATIVE

## 2015-05-07 NOTE — Telephone Encounter (Signed)
Patient was calling for her results. 10:37 Patient notified all results negative- wet prep has not resulted yet.

## 2015-05-10 LAB — SURESWAB BACTERIAL VAGINOSIS/ITIS
Atopobium vaginae: NOT DETECTED Log (cells/mL)
C. PARAPSILOSIS, DNA: DETECTED — AB
C. TROPICALIS, DNA: NOT DETECTED
C. albicans, DNA: NOT DETECTED
C. glabrata, DNA: NOT DETECTED
Gardnerella vaginalis: 5.8 Log (cells/mL)
LACTOBACILLUS SPECIES: 7.1 Log (cells/mL)
MEGASPHAERA SPECIES: NOT DETECTED Log (cells/mL)
T. vaginalis RNA, QL TMA: NOT DETECTED

## 2015-05-11 ENCOUNTER — Other Ambulatory Visit: Payer: Self-pay | Admitting: Obstetrics

## 2015-05-11 DIAGNOSIS — B3731 Acute candidiasis of vulva and vagina: Secondary | ICD-10-CM

## 2015-05-11 DIAGNOSIS — B373 Candidiasis of vulva and vagina: Secondary | ICD-10-CM

## 2015-05-11 MED ORDER — FLUCONAZOLE 150 MG PO TABS: 150.0000 mg | ORAL_TABLET | Freq: Once | ORAL | Status: DC

## 2015-06-06 ENCOUNTER — Ambulatory Visit
Admission: RE | Admit: 2015-06-06 | Discharge: 2015-06-06 | Disposition: A | Discharge status: Home / Self Care | Payer: BLUE CROSS/BLUE SHIELD | Source: Ambulatory Visit

## 2015-06-06 DIAGNOSIS — Z803 Family history of malignant neoplasm of breast: Secondary | ICD-10-CM

## 2015-06-06 DIAGNOSIS — Z1231 Encounter for screening mammogram for malignant neoplasm of breast: Secondary | ICD-10-CM

## 2015-07-14 ENCOUNTER — Ambulatory Visit: Payer: BC Managed Care – PPO | Admitting: Obstetrics

## 2015-07-29 ENCOUNTER — Ambulatory Visit: Payer: BLUE CROSS/BLUE SHIELD | Admitting: Obstetrics

## 2015-07-29 ENCOUNTER — Encounter: Payer: Self-pay | Admitting: Obstetrics

## 2015-07-29 ENCOUNTER — Ambulatory Visit (INDEPENDENT_AMBULATORY_CARE_PROVIDER_SITE_OTHER): Payer: BLUE CROSS/BLUE SHIELD | Admitting: Obstetrics

## 2015-07-29 VITALS — BP 117/83 | HR 80 | Temp 97.4°F | Ht 64.0 in | Wt 165.0 lb

## 2015-07-29 DIAGNOSIS — N946 Dysmenorrhea, unspecified: Secondary | ICD-10-CM

## 2015-07-29 DIAGNOSIS — N760 Acute vaginitis: Secondary | ICD-10-CM

## 2015-07-29 DIAGNOSIS — Z01419 Encounter for gynecological examination (general) (routine) without abnormal findings: Secondary | ICD-10-CM | POA: Diagnosis not present

## 2015-07-29 DIAGNOSIS — B373 Candidiasis of vulva and vagina: Secondary | ICD-10-CM

## 2015-07-29 DIAGNOSIS — B9689 Other specified bacterial agents as the cause of diseases classified elsewhere: Secondary | ICD-10-CM

## 2015-07-29 DIAGNOSIS — F411 Generalized anxiety disorder: Secondary | ICD-10-CM

## 2015-07-29 DIAGNOSIS — B3731 Acute candidiasis of vulva and vagina: Secondary | ICD-10-CM

## 2015-07-29 DIAGNOSIS — Z113 Encounter for screening for infections with a predominantly sexual mode of transmission: Secondary | ICD-10-CM

## 2015-07-29 LAB — CBC
HCT: 34.9 % — ABNORMAL LOW (ref 36.0–46.0)
Hemoglobin: 11.8 g/dL — ABNORMAL LOW (ref 12.0–15.0)
MCH: 28.9 pg (ref 26.0–34.0)
MCHC: 33.8 g/dL (ref 30.0–36.0)
MCV: 85.5 fL (ref 78.0–100.0)
MPV: 9 fL (ref 8.6–12.4)
PLATELETS: 383 10*3/uL (ref 150–400)
RBC: 4.08 MIL/uL (ref 3.87–5.11)
RDW: 14.1 % (ref 11.5–15.5)
WBC: 6 10*3/uL (ref 4.0–10.5)

## 2015-07-29 LAB — COMPREHENSIVE METABOLIC PANEL
ALBUMIN: 4.2 g/dL (ref 3.6–5.1)
ALT: 12 U/L (ref 6–29)
AST: 16 U/L (ref 10–30)
Alkaline Phosphatase: 42 U/L (ref 33–115)
BUN: 9 mg/dL (ref 7–25)
CHLORIDE: 102 mmol/L (ref 98–110)
CO2: 26 mmol/L (ref 20–31)
CREATININE: 0.79 mg/dL (ref 0.50–1.10)
Calcium: 9.4 mg/dL (ref 8.6–10.2)
GLUCOSE: 81 mg/dL (ref 65–99)
Potassium: 4 mmol/L (ref 3.5–5.3)
SODIUM: 138 mmol/L (ref 135–146)
Total Bilirubin: 0.3 mg/dL (ref 0.2–1.2)
Total Protein: 7.3 g/dL (ref 6.1–8.1)

## 2015-07-29 LAB — TRIGLYCERIDES: TRIGLYCERIDES: 61 mg/dL (ref <150)

## 2015-07-29 LAB — HDL CHOLESTEROL: HDL: 51 mg/dL (ref >=46)

## 2015-07-29 LAB — TSH: TSH: 1.221 u[IU]/mL (ref 0.350–4.500)

## 2015-07-29 MED ORDER — FLUCONAZOLE 150 MG PO TABS: 150.0000 mg | ORAL_TABLET | Freq: Once | ORAL | Status: DC

## 2015-07-29 MED ORDER — METRONIDAZOLE 0.75 % VA GEL: 1.0000 | Freq: Two times a day (BID) | VAGINAL | Status: DC

## 2015-07-29 MED ORDER — ALPRAZOLAM 0.5 MG PO TABS: 0.5000 mg | ORAL_TABLET | Freq: Every evening | ORAL | Status: DC | PRN

## 2015-07-29 MED ORDER — OXYCODONE HCL 10 MG PO TABS: 10.0000 mg | ORAL_TABLET | Freq: Four times a day (QID) | ORAL | Status: DC | PRN

## 2015-07-29 NOTE — Progress Notes (Signed)
Subjective:        Dana Escobar is a 34 y.o. female here for a routine exam.  Current complaints: none.    Personal health questionnaire:  Is patient [name], have a family history of breast and/or ovarian cancer: no Is there a family history of uterine cancer diagnosed at age < 7250, gastrointestinal cancer, urinary tract cancer, family member who is a Personnel officerLynch syndrome-associated carrier: no Is the patient overweight and hypertensive, family history of diabetes, personal history of gestational diabetes, preeclampsia or PCOS: no Is patient over 3455, have PCOS,  family history of premature CHD under age 34, diabetes, smoke, have hypertension or peripheral artery disease:  no At any time, has a partner hit, kicked or otherwise hurt or frightened you?: no Over the past 2 weeks, have you felt down, depressed or hopeless?: no Over the past 2 weeks, have you felt little interest or pleasure in doing things?:no   Gynecologic History Patient's last menstrual period was 07/16/2015 (approximate). Contraception: condoms Last Pap: 2015. Results were: normal Last mammogram: 2016. Results were: normal  Obstetric History OB History  Gravida Para Term Preterm AB SAB TAB Ectopic Multiple Living  2 1 1  1  1   1     # Outcome Date GA Lbr Len/2nd Weight Sex Delivery Anes PTL Lv  2 TAB           1 Term      Vag-Spont   Y      Past Medical History  Diagnosis Date  . Depression 02/28/2012  . Fibromyalgia     History reviewed. No pertinent past surgical history.   Current outpatient prescriptions:  .  ALPRAZolam (XANAX) 0.5 MG tablet, Take 1 tablet (0.5 mg total) by mouth at bedtime as needed for anxiety., Disp: 30 tablet, Rfl: 2 .  Multiple Vitamin (MULTIVITAMIN) capsule, Take 1 capsule by mouth daily., Disp: , Rfl:  .  traMADol (ULTRAM) 50 MG tablet, Take 1 tablet (50 mg total) by mouth every 6 (six) hours as needed for moderate pain., Disp: 40 tablet, Rfl: 4 .  acetaminophen  (TYLENOL) 500 MG tablet, Take 1,000 mg by mouth every 6 (six) hours as needed for moderate pain., Disp: , Rfl:  .  fluconazole (DIFLUCAN) 150 MG tablet, Take 1 tablet (150 mg total) by mouth once., Disp: 1 tablet, Rfl: 2 .  Lysine 500 MG CAPS, Take 1 capsule by mouth daily., Disp: , Rfl:  .  metroNIDAZOLE (METROGEL VAGINAL) 0.75 % vaginal gel, Place 1 Applicatorful vaginally 2 (two) times daily., Disp: 70 g, Rfl: 4 .  Oxycodone HCl 10 MG TABS, Take 1 tablet (10 mg total) by mouth every 6 (six) hours as needed., Disp: 40 tablet, Rfl: 0 .  tinidazole (TINDAMAX) 500 MG tablet, Take 2 tablets (1,000 mg total) by mouth daily with breakfast. (Patient not taking: Reported on 07/29/2015), Disp: 10 tablet, Rfl: 4 No Known Allergies  Social History  Substance Use Topics  . Smoking status: Never Smoker   . Smokeless tobacco: Never Used  . Alcohol Use: 0.0 oz/week    0 Standard drinks or equivalent per week     Comment: ocassionally    Family History  Problem Relation Age of Onset  . Breast cancer Mother 4332  . Breast cancer        Review of Systems  Constitutional: negative for fatigue and weight loss Respiratory: negative for cough and wheezing Cardiovascular: negative for chest pain, fatigue and palpitations Gastrointestinal: negative for abdominal  pain and change in bowel habits Musculoskeletal:negative for myalgias Neurological: negative for gait problems and tremors Behavioral/Psych: negative for abusive relationship, depression Endocrine: negative for temperature intolerance   Genitourinary:negative for abnormal menstrual periods, genital lesions, hot flashes, sexual problems and vaginal discharge Integument/breast: negative for breast lump, breast tenderness, nipple discharge and skin lesion(s)    Objective:       BP 117/83 mmHg  Pulse 80  Temp(Src) 97.4 F (36.3 C)  Ht  (1.626 m)  Wt 165 lb (74.844 kg)  BMI 28.31 kg/m2  LMP 07/16/2015 (Approximate) General:   alert   Skin:   no rash or abnormalities  Lungs:   clear to auscultation bilaterally  Heart:   regular rate and rhythm, S1, S2 normal, no murmur, click, rub or gallop  Breasts:   normal without suspicious masses, skin or nipple changes or axillary nodes  Abdomen:  normal findings: no organomegaly, soft, non-tender and no hernia  Pelvis:  External genitalia: normal general appearance Urinary system: urethral meatus normal and bladder without fullness, nontender Vaginal: normal without tenderness, induration or masses Cervix: normal appearance Adnexa: normal bimanual exam Uterus: anteverted and non-tender, normal size   Lab Review Urine pregnancy test Labs reviewed yes Radiologic studies reviewed yes    Assessment:    Healthy female exam.    Plan:    Education reviewed: calcium supplements, low fat, low cholesterol diet, safe sex/STD prevention, self breast exams and weight bearing exercise. Contraception: condoms. Follow up in: 1 year.   Meds ordered this encounter  Medications  . fluconazole (DIFLUCAN) 150 MG tablet    Sig: Take 1 tablet (150 mg total) by mouth once.    Dispense:  1 tablet    Refill:  2  . metroNIDAZOLE (METROGEL VAGINAL) 0.75 % vaginal gel    Sig: Place 1 Applicatorful vaginally 2 (two) times daily.    Dispense:  70 g    Refill:  4  . ALPRAZolam (XANAX) 0.5 MG tablet    Sig: Take 1 tablet (0.5 mg total) by mouth at bedtime as needed for anxiety.    Dispense:  30 tablet    Refill:  2  . Oxycodone HCl 10 MG TABS    Sig: Take 1 tablet (10 mg total) by mouth every 6 (six) hours as needed.    Dispense:  40 tablet    Refill:  0   Orders Placed This Encounter  Procedures  . SureSwab, Vaginosis/Vaginitis Plus  . SureSwab, Vaginosis/Vaginitis Plus  . HIV antibody  . RPR  . Hepatitis C antibody  . Hepatitis B surface antigen  . Comprehensive metabolic panel  . TSH  . Triglycerides  . HDL cholesterol  . CBC

## 2015-07-30 ENCOUNTER — Encounter: Payer: Self-pay | Admitting: *Deleted

## 2015-07-30 LAB — RPR

## 2015-07-30 LAB — PAP IG W/ RFLX HPV ASCU

## 2015-07-30 LAB — HIV ANTIBODY (ROUTINE TESTING W REFLEX): HIV: NONREACTIVE

## 2015-07-30 LAB — HEPATITIS C ANTIBODY: HCV AB: NEGATIVE

## 2015-07-30 LAB — HEPATITIS B SURFACE ANTIGEN: HEP B S AG: NEGATIVE

## 2015-08-01 ENCOUNTER — Telehealth: Payer: Self-pay | Admitting: *Deleted

## 2015-08-01 NOTE — Telephone Encounter (Signed)
Patient contacted the office requesting her lab results. Patient advised of lab results and verbalized understanding. Patient advised sureswab has not resulted at this time.

## 2015-08-02 LAB — SURESWAB, VAGINOSIS/VAGINITIS PLUS
ATOPOBIUM VAGINAE: NOT DETECTED Log (cells/mL)
C. ALBICANS, DNA: NOT DETECTED
C. GLABRATA, DNA: NOT DETECTED
C. PARAPSILOSIS, DNA: NOT DETECTED
C. TRACHOMATIS RNA, TMA: NOT DETECTED
C. tropicalis, DNA: NOT DETECTED
Gardnerella vaginalis: <4.7 Log (cells/mL)
LACTOBACILLUS SPECIES: 6.6 Log (cells/mL)
MEGASPHAERA SPECIES: NOT DETECTED Log (cells/mL)
N. GONORRHOEAE RNA, TMA: NOT DETECTED
T. VAGINALIS RNA, QL TMA: NOT DETECTED
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
## 2016-01-19 ENCOUNTER — Encounter: Payer: Self-pay | Admitting: Obstetrics

## 2016-01-19 ENCOUNTER — Other Ambulatory Visit: Payer: Self-pay | Admitting: Obstetrics

## 2016-01-19 ENCOUNTER — Ambulatory Visit (INDEPENDENT_AMBULATORY_CARE_PROVIDER_SITE_OTHER): Payer: Managed Care, Other (non HMO) | Admitting: Obstetrics

## 2016-01-19 VITALS — BP 114/79 | HR 112 | Temp 99.0°F

## 2016-01-19 DIAGNOSIS — N898 Other specified noninflammatory disorders of vagina: Secondary | ICD-10-CM

## 2016-01-19 DIAGNOSIS — R350 Frequency of micturition: Secondary | ICD-10-CM | POA: Diagnosis not present

## 2016-01-19 DIAGNOSIS — Z113 Encounter for screening for infections with a predominantly sexual mode of transmission: Secondary | ICD-10-CM | POA: Diagnosis not present

## 2016-01-19 LAB — POCT URINALYSIS DIPSTICK
Bilirubin, UA: NEGATIVE
Glucose, UA: NEGATIVE
KETONES UA: NEGATIVE
PH UA: 5
RBC UA: NEGATIVE
Spec Grav, UA: 1.02
Urobilinogen, UA: NEGATIVE

## 2016-01-22 ENCOUNTER — Encounter: Payer: Self-pay | Admitting: Obstetrics

## 2016-01-22 LAB — HIV ANTIBODY (ROUTINE TESTING W REFLEX): HIV SCREEN 4TH GENERATION: NONREACTIVE

## 2016-01-22 LAB — HEPATITIS B SURFACE ANTIGEN: HEP B S AG: NEGATIVE

## 2016-01-22 LAB — RPR: RPR Ser Ql: NONREACTIVE

## 2016-01-22 LAB — HEPATITIS C ANTIBODY: Hep C Virus Ab: <0.1 s/co ratio (ref 0.0–0.9)

## 2016-01-22 NOTE — Progress Notes (Signed)
Patient ID: Dana Escobar, female   DOB: Feb 16, 1981, 35 y.o.   MRN: [id number]  Chief Complaint  Patient presents with  . Vaginal Discharge    plus frequent urination    HPI Dana Escobar is a 35 y.o. female.  Malodorous vaginal discharge and urinary frequency.  HPI  Past Medical History  Diagnosis Date  . Depression 02/28/2012  . Fibromyalgia     History reviewed. No pertinent past surgical history.  Family History  Problem Relation Age of Onset  . Breast cancer Mother 7532  . Breast cancer      Social History Social History  Substance Use Topics  . Smoking status: Never Smoker   . Smokeless tobacco: Never Used  . Alcohol Use: 0.0 oz/week    0 Standard drinks or equivalent per week     Comment: ocassionally    No Known Allergies  Current Outpatient Prescriptions  Medication Sig Dispense Refill  . acetaminophen (TYLENOL) 500 MG tablet Take 1,000 mg by mouth every 6 (six) hours as needed for moderate pain.    Marland Kitchen. ALPRAZolam (XANAX) 0.5 MG tablet Take 1 tablet (0.5 mg total) by mouth at bedtime as needed for anxiety. 30 tablet 2  . Lysine 500 MG CAPS Take 1 capsule by mouth daily.    . metroNIDAZOLE (METROGEL VAGINAL) 0.75 % vaginal gel Place 1 Applicatorful vaginally 2 (two) times daily. 70 g 4  . Multiple Vitamin (MULTIVITAMIN) capsule Take 1 capsule by mouth daily.    . Oxycodone HCl 10 MG TABS Take 1 tablet (10 mg total) by mouth every 6 (six) hours as needed. 40 tablet 0  . tinidazole (TINDAMAX) 500 MG tablet Take 2 tablets (1,000 mg total) by mouth daily with breakfast. 10 tablet 4  . traMADol (ULTRAM) 50 MG tablet Take 1 tablet (50 mg total) by mouth every 6 (six) hours as needed for moderate pain. 40 tablet 4   No current facility-administered medications for this visit.    Review of Systems Review of Systems Constitutional: negative for fatigue and weight loss Respiratory: negative for cough and wheezing Cardiovascular: negative for chest pain, fatigue and  palpitations Gastrointestinal: negative for abdominal pain and change in bowel habits Genitourinary:negative Integument/breast: negative for nipple discharge Musculoskeletal:negative for myalgias Neurological: negative for gait problems and tremors Behavioral/Psych: negative for abusive relationship, depression Endocrine: negative for temperature intolerance     Blood pressure 114/79, pulse 112, temperature 99 F (37.2 C).  Physical Exam Physical Exam General:   alert  Skin:   no rash or abnormalities  Lungs:   clear to auscultation bilaterally  Heart:   regular rate and rhythm, S1, S2 normal, no murmur, click, rub or gallop  Breasts:   normal without suspicious masses, skin or nipple changes or axillary nodes  Abdomen:  normal findings: no organomegaly, soft, non-tender and no hernia  Pelvis:  External genitalia: normal general appearance Urinary system: urethral meatus normal and bladder without fullness, nontender Vaginal: normal without tenderness, induration or masses Cervix: normal appearance Adnexa: normal bimanual exam Uterus: anteverted and non-tender, normal size      Data Reviewed Wet Prep  Assessment     Vaginal diasgarge Urinary frequency      Plan     STD labs sent and GC and Chlamydia cultures sent   Tindamax Rx  Urine culture sent F/U prn  Orders Placed This Encounter  Procedures  . Urine culture  . HIV antibody  . Hepatitis B surface antigen  . RPR  .  Hepatitis C antibody  . POCT urinalysis dipstick   No orders of the defined types were placed in this encounter.

## 2016-01-23 ENCOUNTER — Other Ambulatory Visit: Payer: Self-pay | Admitting: Obstetrics

## 2016-01-23 DIAGNOSIS — N390 Urinary tract infection, site not specified: Secondary | ICD-10-CM

## 2016-01-23 LAB — URINE CULTURE

## 2016-01-23 MED ORDER — CEFUROXIME AXETIL 500 MG PO TABS: 500.0000 mg | ORAL_TABLET | Freq: Two times a day (BID) | ORAL | Status: DC

## 2016-01-24 LAB — NUSWAB VG+, CANDIDA 6SP
CANDIDA ALBICANS, NAA: NEGATIVE
CANDIDA GLABRATA, NAA: NEGATIVE
CANDIDA PARAPSILOSIS, NAA: NEGATIVE
CANDIDA TROPICALIS, NAA: NEGATIVE
Candida krusei, NAA: NEGATIVE
Candida lusitaniae, NAA: NEGATIVE
Chlamydia trachomatis, NAA: NEGATIVE
Neisseria gonorrhoeae, NAA: NEGATIVE
Trich vag by NAA: NEGATIVE

## 2016-02-13 ENCOUNTER — Encounter: Payer: Self-pay | Admitting: Obstetrics

## 2016-02-13 ENCOUNTER — Ambulatory Visit (INDEPENDENT_AMBULATORY_CARE_PROVIDER_SITE_OTHER): Payer: Managed Care, Other (non HMO) | Admitting: Obstetrics

## 2016-02-13 VITALS — BP 118/80 | HR 74 | Wt 158.0 lb

## 2016-02-13 DIAGNOSIS — N63 Unspecified lump in unspecified breast: Secondary | ICD-10-CM

## 2016-02-13 NOTE — Progress Notes (Signed)
Patient ID: Dana Escobar, female   DOB: 08/15/1981, 35 y.o.   MRN: [id number]  Chief Complaint  Patient presents with  . Breast Mass    Right    HPI Dana Escobar is a 35 y.o. female.  Mass felt in right breast 2-3 days ago for the first time, soft, mobile, non tender. HPI  Past Medical History  Diagnosis Date  . Depression 02/28/2012  . Fibromyalgia     No past surgical history on file.  Family History  Problem Relation Age of Onset  . Breast cancer Mother 1132  . Breast cancer      Social History Social History  Substance Use Topics  . Smoking status: Never Smoker   . Smokeless tobacco: Never Used  . Alcohol Use: 0.0 oz/week    0 Standard drinks or equivalent per week     Comment: ocassionally    No Known Allergies  Current Outpatient Prescriptions  Medication Sig Dispense Refill  . acetaminophen (TYLENOL) 500 MG tablet Take 1,000 mg by mouth every 6 (six) hours as needed for moderate pain.    Marland Kitchen. ALPRAZolam (XANAX) 0.5 MG tablet Take 1 tablet (0.5 mg total) by mouth at bedtime as needed for anxiety. 30 tablet 2  . Lysine 500 MG CAPS Take 1 capsule by mouth daily.    . Multiple Vitamin (MULTIVITAMIN) capsule Take 1 capsule by mouth daily.    Marland Kitchen. tinidazole (TINDAMAX) 500 MG tablet TAKE 2 TABLETS (1,000 MG TOTAL) BY MOUTH DAILY WITH BREAKFAST. 10 tablet 4  . traMADol (ULTRAM) 50 MG tablet Take 1 tablet (50 mg total) by mouth every 6 (six) hours as needed for moderate pain. 40 tablet 4   No current facility-administered medications for this visit.    Review of Systems Review of Systems Constitutional: negative for fatigue and weight loss Respiratory: negative for cough and wheezing Cardiovascular: negative for chest pain, fatigue and palpitations Gastrointestinal: negative for abdominal pain and change in bowel habits Genitourinary:negative Integument/breast: positive for lump in right breast3negative for nipple discharge Musculoskeletal:negative for  myalgias Neurological: negative for gait problems and tremors Behavioral/Psych: negative for abusive relationship, depression Endocrine: negative for temperature intolerance     Blood pressure 118/80, pulse 74, weight 158 lb (71.668 kg).  Physical Exam Physical Exam General:   alert and no distress  Skin:   no rash or abnormalities  Lungs:   clear to auscultation bilaterally  Heart:   regular rate and rhythm, S1, S2 normal, no murmur, click, rub or gallop  Breasts:   normal without suspicious masses, skin or nipple changes or axillary nodes    Data Reviewed Previous Mammogram / Breast Ultrasound  Assessment     Breast lump felt by patient.  Normal breast exam.     Plan    F/U in 2 weeks, after period   No orders of the defined types were placed in this encounter.   No orders of the defined types were placed in this encounter.

## 2016-02-25 ENCOUNTER — Encounter: Payer: Self-pay | Admitting: Certified Nurse Midwife

## 2016-02-25 ENCOUNTER — Ambulatory Visit (INDEPENDENT_AMBULATORY_CARE_PROVIDER_SITE_OTHER): Payer: Managed Care, Other (non HMO) | Admitting: Obstetrics

## 2016-02-25 VITALS — BP 123/82 | HR 79 | Temp 98.0°F | Wt 160.0 lb

## 2016-02-25 DIAGNOSIS — N6310 Unspecified lump in the right breast, unspecified quadrant: Secondary | ICD-10-CM

## 2016-02-25 DIAGNOSIS — N6320 Unspecified lump in the left breast, unspecified quadrant: Principal | ICD-10-CM

## 2016-02-25 DIAGNOSIS — N63 Unspecified lump in breast: Secondary | ICD-10-CM
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
## 2016-03-05 ENCOUNTER — Other Ambulatory Visit: Payer: Self-pay | Admitting: Obstetrics

## 2016-03-05 DIAGNOSIS — N6320 Unspecified lump in the left breast, unspecified quadrant: Principal | ICD-10-CM

## 2016-03-05 DIAGNOSIS — N6310 Unspecified lump in the right breast, unspecified quadrant: Secondary | ICD-10-CM

## 2016-03-11 ENCOUNTER — Ambulatory Visit
Admission: RE | Admit: 2016-03-11 | Discharge: 2016-03-11 | Disposition: A | Discharge status: Home / Self Care | Payer: 59 | Source: Ambulatory Visit | Attending: Obstetrics | Admitting: Obstetrics

## 2016-03-11 DIAGNOSIS — N6310 Unspecified lump in the right breast, unspecified quadrant: Secondary | ICD-10-CM

## 2016-03-11 DIAGNOSIS — N6320 Unspecified lump in the left breast, unspecified quadrant: Principal | ICD-10-CM

## 2016-05-20 ENCOUNTER — Ambulatory Visit: Payer: Managed Care, Other (non HMO) | Admitting: Obstetrics

## 2016-05-20 ENCOUNTER — Encounter: Payer: Self-pay | Admitting: Obstetrics

## 2016-05-20 VITALS — BP 111/77 | HR 69 | Wt 156.0 lb

## 2016-05-20 DIAGNOSIS — M549 Dorsalgia, unspecified: Secondary | ICD-10-CM | POA: Diagnosis not present

## 2016-05-20 DIAGNOSIS — N760 Acute vaginitis: Secondary | ICD-10-CM | POA: Diagnosis not present

## 2016-05-20 DIAGNOSIS — Z113 Encounter for screening for infections with a predominantly sexual mode of transmission: Secondary | ICD-10-CM | POA: Diagnosis not present

## 2016-05-20 DIAGNOSIS — B9689 Other specified bacterial agents as the cause of diseases classified elsewhere: Secondary | ICD-10-CM | POA: Diagnosis not present

## 2016-05-20 MED ORDER — METHOCARBAMOL 500 MG PO TABS: 500.0000 mg | ORAL_TABLET | Freq: Three times a day (TID) | ORAL | 4 refills | Status: DC

## 2016-05-20 MED ORDER — CYCLOBENZAPRINE HCL 10 MG PO TABS: 10.0000 mg | ORAL_TABLET | Freq: Three times a day (TID) | ORAL | 2 refills | Status: DC | PRN

## 2016-05-20 MED ORDER — TINIDAZOLE 500 MG PO TABS: ORAL_TABLET | ORAL | 4 refills | Status: DC

## 2016-05-20 NOTE — Progress Notes (Signed)
Patient ID: Dana Escobar, female   DOB: 1981-03-21, 35 y.o.   MRN: [id number]  Chief Complaint  Patient presents with  . Follow-up    needs std screening- has new partner    HPI Dana Escobar is a 35 y.o. female.  Presents for follow up for recurrent BV.  Also has muscle aches, mainly backache.  Recently started relationship with a new sexual partner, and requests STD screening. HPI  Past Medical History:  Diagnosis Date  . Depression 02/28/2012  . Fibromyalgia     History reviewed. No pertinent surgical history.  Family History  Problem Relation Age of Onset  . Breast cancer Mother 77  . Breast cancer      Social History Social History  Substance Use Topics  . Smoking status: Never Smoker  . Smokeless tobacco: Never Used  . Alcohol use 0.0 oz/week     Comment: ocassionally    No Known Allergies  Current Outpatient Prescriptions  Medication Sig Dispense Refill  . acetaminophen (TYLENOL) 500 MG tablet Take 1,000 mg by mouth every 6 (six) hours as needed for moderate pain.    Marland Kitchen ALPRAZolam (XANAX) 0.5 MG tablet Take 1 tablet (0.5 mg total) by mouth at bedtime as needed for anxiety. 30 tablet 2  . cyclobenzaprine (FLEXERIL) 10 MG tablet Take 1 tablet (10 mg total) by mouth every 8 (eight) hours as needed for muscle spasms. 42 tablet 2  . Lysine 500 MG CAPS Take 1 capsule by mouth daily.    . methocarbamol (ROBAXIN) 500 MG tablet Take 1 tablet (500 mg total) by mouth 3 (three) times daily. 90 tablet 4  . Multiple Vitamin (MULTIVITAMIN) capsule Take 1 capsule by mouth daily.    Marland Kitchen tinidazole (TINDAMAX) 500 MG tablet TAKE 2 TABLETS (1,000 MG TOTAL) BY MOUTH DAILY WITH BREAKFAST. 10 tablet 4  . traMADol (ULTRAM) 50 MG tablet Take 1 tablet (50 mg total) by mouth every 6 (six) hours as needed for moderate pain. (Patient not taking: Reported on 02/25/2016) 40 tablet 4   No current facility-administered medications for this visit.     Review of Systems Review of  Systems Constitutional: negative for fatigue and weight loss Respiratory: negative for cough and wheezing Cardiovascular: negative for chest pain, fatigue and palpitations Gastrointestinal: negative for abdominal pain and change in bowel habits Genitourinary:positive for malodorous vaginal discharge Integument/breast: negative for nipple discharge Musculoskeletal:positive for myalgias  Neurological: negative for gait problems and tremors Behavioral/Psych: positive for anxiety / depression Endocrine: negative for temperature intolerance     Blood pressure 111/77, pulse 69, weight 156 lb (70.8 kg), last menstrual period 05/10/2016.  Physical Exam Physical Exam General:   alert and no distress  Skin:   no rash or abnormalities  Lungs:   clear to auscultation bilaterally  Heart:   regular rate and rhythm, S1, S2 normal, no murmur, click, rub or gallop  Breasts:   normal without suspicious masses, skin or nipple changes or axillary nodes  Abdomen:  normal findings: no organomegaly, soft, non-tender and no hernia  Pelvis:  External genitalia: normal general appearance Urinary system: urethral meatus normal and bladder without fullness, nontender Vaginal: normal without tenderness, induration or masses.  Grey, thin discharge Cervix: normal appearance Adnexa: normal bimanual exam Uterus: anteverted and non-tender, normal size    50% of 15 min visit spent on counseling and coordination of care.   Data Reviewed Previous wet preps Previous cultures  Assessment      BV, recurrent  New sexual  partner  Backache  Anxiety / Depression  Plan    Wet prep and cultures done Screening for STD's Hylafem recommended if wet prep is positive for BV Robaxin / Ibuprofen Rx for myalgias Counseling recommended for anxiety / depression F/U 3 months  Orders Placed This Encounter  Procedures  . HIV antibody  . Hepatitis B surface antigen  . RPR  . Hepatitis C antibody  . HIV antibody  .  Hepatitis B surface antigen  . RPR  . Hepatitis C antibody   Meds ordered this encounter  Medications  . tinidazole (TINDAMAX) 500 MG tablet    Sig: TAKE 2 TABLETS (1,000 MG TOTAL) BY MOUTH DAILY WITH BREAKFAST.    Dispense:  10 tablet    Refill:  4  . methocarbamol (ROBAXIN) 500 MG tablet    Sig: Take 1 tablet (500 mg total) by mouth 3 (three) times daily.    Dispense:  90 tablet    Refill:  4  . cyclobenzaprine (FLEXERIL) 10 MG tablet    Sig: Take 1 tablet (10 mg total) by mouth every 8 (eight) hours as needed for muscle spasms.    Dispense:  42 tablet    Refill:  2

## 2016-05-21 LAB — HIV ANTIBODY (ROUTINE TESTING W REFLEX): HIV SCREEN 4TH GENERATION: NONREACTIVE

## 2016-05-21 LAB — RPR: RPR Ser Ql: NONREACTIVE

## 2016-05-21 LAB — HEPATITIS B SURFACE ANTIGEN: HEP B S AG: NEGATIVE

## 2016-05-21 LAB — CERVICOVAGINAL ANCILLARY ONLY
CHLAMYDIA, DNA PROBE: NEGATIVE
Neisseria Gonorrhea: NEGATIVE
Trichomonas: NEGATIVE

## 2016-05-21 LAB — HEPATITIS C ANTIBODY: Hep C Virus Ab: <0.1 s/co ratio (ref 0.0–0.9)

## 2016-05-24 ENCOUNTER — Other Ambulatory Visit: Payer: Self-pay | Admitting: Obstetrics

## 2016-05-24 LAB — CERVICOVAGINAL ANCILLARY ONLY
BACTERIAL VAGINITIS: NEGATIVE
Candida vaginitis: NEGATIVE

## 2016-07-29 ENCOUNTER — Encounter: Payer: Self-pay | Admitting: Obstetrics

## 2016-07-29 ENCOUNTER — Ambulatory Visit (INDEPENDENT_AMBULATORY_CARE_PROVIDER_SITE_OTHER): Payer: Managed Care, Other (non HMO) | Admitting: Obstetrics

## 2016-07-29 VITALS — BP 112/78 | HR 87 | Temp 97.2°F | Wt 155.0 lb

## 2016-07-29 DIAGNOSIS — Z0000 Encounter for general adult medical examination without abnormal findings: Secondary | ICD-10-CM

## 2016-07-29 DIAGNOSIS — Z01419 Encounter for gynecological examination (general) (routine) without abnormal findings: Secondary | ICD-10-CM

## 2016-07-29 DIAGNOSIS — N946 Dysmenorrhea, unspecified: Secondary | ICD-10-CM

## 2016-07-29 DIAGNOSIS — Z113 Encounter for screening for infections with a predominantly sexual mode of transmission: Secondary | ICD-10-CM

## 2016-07-29 DIAGNOSIS — Z124 Encounter for screening for malignant neoplasm of cervix: Secondary | ICD-10-CM

## 2016-07-29 DIAGNOSIS — B9689 Other specified bacterial agents as the cause of diseases classified elsewhere: Secondary | ICD-10-CM

## 2016-07-29 DIAGNOSIS — F411 Generalized anxiety disorder: Secondary | ICD-10-CM

## 2016-07-29 DIAGNOSIS — N760 Acute vaginitis: Secondary | ICD-10-CM

## 2016-07-29 MED ORDER — TRAMADOL HCL 50 MG PO TABS: 50.0000 mg | ORAL_TABLET | Freq: Four times a day (QID) | ORAL | 4 refills | Status: DC | PRN

## 2016-07-29 MED ORDER — ALPRAZOLAM 0.5 MG PO TABS: 0.5000 mg | ORAL_TABLET | Freq: Every evening | ORAL | 2 refills | Status: DC | PRN

## 2016-07-29 MED ORDER — TINIDAZOLE 500 MG PO TABS: ORAL_TABLET | ORAL | 4 refills | Status: DC

## 2016-07-29 MED ORDER — METRONIDAZOLE 0.75 % VA GEL: 1.0000 | Freq: Two times a day (BID) | VAGINAL | 4 refills | Status: DC

## 2016-07-29 NOTE — Progress Notes (Signed)
Subjective:        Dana Escobar is a 35 y.o. female here for a routine exam.  Current complaints: None.    Personal health questionnaire:  Is patient Dana Escobar, have a family history of breast and/or ovarian cancer: no Is there a family history of uterine cancer diagnosed at age < 10650, gastrointestinal cancer, urinary tract cancer, family member who is a Personnel officerLynch syndrome-associated carrier: no Is the patient overweight and hypertensive, family history of diabetes, personal history of gestational diabetes, preeclampsia or PCOS: no Is patient over 155, have PCOS,  family history of premature CHD under age 35, diabetes, smoke, have hypertension or peripheral artery disease:  no At any time, has a partner hit, kicked or otherwise hurt or frightened you?: no Over the past 2 weeks, have you felt down, depressed or hopeless?: no Over the past 2 weeks, have you felt little interest or pleasure in doing things?:no   Gynecologic History Patient's last menstrual period was 07/05/2016 (approximate). Contraception: condoms Last Pap: 2016. Results were: normal Last mammogram: 2017. Results were: normal  Obstetric History OB History  Gravida Para Term Preterm AB Living  2 1 1   1 1   SAB TAB Ectopic Multiple Live Births    1     1    # Outcome Date GA Lbr Len/2nd Weight Sex Delivery Anes PTL Lv  2 TAB           1 Term      Vag-Spont   LIV      Past Medical History:  Diagnosis Date  . Depression 02/28/2012  . Fibromyalgia     History reviewed. No pertinent surgical history.   Current Outpatient Prescriptions:  .  acetaminophen (TYLENOL) 500 MG tablet, Take 1,000 mg by mouth every 6 (six) hours as needed for moderate pain., Disp: , Rfl:  .  ALPRAZolam (XANAX) 0.5 MG tablet, Take 1 tablet (0.5 mg total) by mouth at bedtime as needed for anxiety., Disp: 30 tablet, Rfl: 2 .  cyclobenzaprine (FLEXERIL) 10 MG tablet, Take 1 tablet (10 mg total) by mouth every 8 (eight) hours as needed  for muscle spasms., Disp: 42 tablet, Rfl: 2 .  Lysine 500 MG CAPS, Take 1 capsule by mouth daily., Disp: , Rfl:  .  methocarbamol (ROBAXIN) 500 MG tablet, Take 1 tablet (500 mg total) by mouth 3 (three) times daily., Disp: 90 tablet, Rfl: 4 .  Multiple Vitamin (MULTIVITAMIN) capsule, Take 1 capsule by mouth daily., Disp: , Rfl:  .  tinidazole (TINDAMAX) 500 MG tablet, TAKE 2 TABLETS (1,000 MG TOTAL) BY MOUTH DAILY WITH BREAKFAST., Disp: 10 tablet, Rfl: 4 .  metroNIDAZOLE (METROGEL VAGINAL) 0.75 % vaginal gel, Place 1 Applicatorful vaginally 2 (two) times daily., Disp: 70 g, Rfl: 4 .  traMADol (ULTRAM) 50 MG tablet, Take 1 tablet (50 mg total) by mouth every 6 (six) hours as needed for moderate pain., Disp: 30 tablet, Rfl: 4 No Known Allergies  Social History  Substance Use Topics  . Smoking status: Never Smoker  . Smokeless tobacco: Never Used  . Alcohol use 0.0 oz/week     Comment: ocassionally    Family History  Problem Relation Age of Onset  . Breast cancer Mother 3232  . Breast cancer        Review of Systems  Constitutional: negative for fatigue and weight loss Respiratory: negative for cough and wheezing Cardiovascular: negative for chest pain, fatigue and palpitations Gastrointestinal: negative for abdominal pain and change  in bowel habits Musculoskeletal:negative for myalgias Neurological: negative for gait problems and tremors Behavioral/Psych: negative for abusive relationship, depression Endocrine: negative for temperature intolerance    Genitourinary:negative for abnormal menstrual periods, genital lesions, hot flashes, sexual problems and vaginal discharge Integument/breast: negative for breast lump, breast tenderness, nipple discharge and skin lesion(s)    Objective:       BP 112/78   Pulse 87   Temp 97.2 F (36.2 C) (Oral)   Wt 155 lb (70.3 kg)   LMP 07/05/2016 (Approximate)   BMI 26.61 kg/m  General:   alert  Skin:   no rash or abnormalities  Lungs:    clear to auscultation bilaterally  Heart:   regular rate and rhythm, S1, S2 normal, no murmur, click, rub or gallop  Breasts:   normal without suspicious masses, skin or nipple changes or axillary nodes  Abdomen:  normal findings: no organomegaly, soft, non-tender and no hernia  Pelvis:  External genitalia: normal general appearance Urinary system: urethral meatus normal and bladder without fullness, nontender Vaginal: normal without tenderness, induration or masses Cervix: normal appearance Adnexa: normal bimanual exam Uterus: anteverted and non-tender, normal size   Lab Review Urine pregnancy test Labs reviewed yes Radiologic studies reviewed yes  50% of 20 min visit spent on counseling and coordination of care.    Assessment:    Healthy female exam.    Anxiety Disorder.  Stable.  Chronic BV.  Currently stable.  Dysmenorrhea.  Stable on NSAIDS.   Plan:    Education reviewed: calcium supplements, depression evaluation, low fat, low cholesterol diet, safe sex/STD prevention, self breast exams and weight bearing exercise. Contraception: condoms. Follow up in: 1 year.   Meds ordered this encounter  Medications  . tinidazole (TINDAMAX) 500 MG tablet    Sig: TAKE 2 TABLETS (1,000 MG TOTAL) BY MOUTH DAILY WITH BREAKFAST.    Dispense:  10 tablet    Refill:  4  . ALPRAZolam (XANAX) 0.5 MG tablet    Sig: Take 1 tablet (0.5 mg total) by mouth at bedtime as needed for anxiety.    Dispense:  30 tablet    Refill:  2  . metroNIDAZOLE (METROGEL VAGINAL) 0.75 % vaginal gel    Sig: Place 1 Applicatorful vaginally 2 (two) times daily.    Dispense:  70 g    Refill:  4  . traMADol (ULTRAM) 50 MG tablet    Sig: Take 1 tablet (50 mg total) by mouth every 6 (six) hours as needed for moderate pain.    Dispense:  30 tablet    Refill:  4   Orders Placed This Encounter  Procedures  . Comprehensive metabolic panel  . TSH  . HIV antibody  . Hepatitis B surface antigen  . RPR  . Hepatitis  C antibody  . CBC     Patient ID: Dana Escobar, female   DOB: [birth year], 35 y.o.   MRN: [id number]

## 2016-07-29 NOTE — Addendum Note (Signed)
Addended by: Francene FindersJAMES, Syrianna Schillaci C on: 07/29/2016 04:43 PM   Modules accepted: Orders

## 2016-07-29 NOTE — Patient Instructions (Addendum)
Bacterial Vaginosis Bacterial vaginosis is a vaginal infection that occurs when the normal balance of bacteria in the vagina is disrupted. It results from an overgrowth of certain bacteria. This is the most common vaginal infection among women ages 15-44. Because bacterial vaginosis increases your risk for STIs (sexually transmitted infections), getting treated can help reduce your risk for chlamydia, gonorrhea, herpes, and HIV (human immunodeficiency virus). Treatment is also important for preventing complications in pregnant women, because this condition can cause an early (premature) delivery. What are the causes? This condition is caused by an increase in harmful bacteria that are normally present in small amounts in the vagina. However, the reason that the condition develops is not fully understood. What increases the risk? The following factors may make you more likely to develop this condition:  Having a new sexual partner or multiple sexual partners.  Having unprotected sex.  Douching.  Having an intrauterine device (IUD).  Smoking.  Drug and alcohol abuse.  Taking certain antibiotic medicines.  Being pregnant.  You cannot get bacterial vaginosis from toilet seats, bedding, swimming pools, or contact with objects around you. What are the signs or symptoms? Symptoms of this condition include:  Grey or white vaginal discharge. The discharge can also be watery or foamy.  A fish-like odor with discharge, especially after sexual intercourse or during menstruation.  Itching in and around the vagina.  Burning or pain with urination.  Some women with bacterial vaginosis have no signs or symptoms. How is this diagnosed? This condition is diagnosed based on:  Your medical history.  A physical exam of the vagina.  Testing a sample of vaginal fluid under a microscope to look for a large amount of bad bacteria or abnormal cells. Your health care provider may use a cotton swab  or a small wooden spatula to collect the sample.  How is this treated? This condition is treated with antibiotics. These may be given as a pill, a vaginal cream, or a medicine that is put into the vagina (suppository). If the condition comes back after treatment, a second round of antibiotics may be needed. Follow these instructions at home: Medicines  Take over-the-counter and prescription medicines only as told by your health care provider.  Take or use your antibiotic as told by your health care provider. Do not stop taking or using the antibiotic even if you start to feel better. General instructions  If you have a female sexual partner, tell her that you have a vaginal infection. She should see her health care provider and be treated if she has symptoms. If you have a female sexual partner, he does not need treatment.  During treatment: ? Avoid sexual activity until you finish treatment. ? Do not douche. ? Avoid alcohol as directed by your health care provider. ? Avoid breastfeeding as directed by your health care provider.  Drink enough water and fluids to keep your urine clear or pale yellow.  Keep the area around your vagina and rectum clean. ? Wash the area daily with warm water. ? Wipe yourself from front to back after using the toilet.  Keep all follow-up visits as told by your health care provider. This is important. How is this prevented?  Do not douche.  Wash the outside of your vagina with warm water only.  Use protection when having sex. This includes latex condoms and dental dams.  Limit how many sexual partners you have. To help prevent bacterial vaginosis, it is best to have sex with just   one partner (monogamous).  Make sure you and your sexual partner are tested for STIs.  Wear cotton or cotton-lined underwear.  Avoid wearing tight pants and pantyhose, especially during summer.  Limit the amount of alcohol that you drink.  Do not use any products that  contain nicotine or tobacco, such as cigarettes and e-cigarettes. If you need help quitting, ask your health care provider.  Do not use illegal drugs. Where to find more information:  Centers for Disease Control and Prevention: www.cdc.gov/std  American Sexual Health Association (ASHA): www.ashastd.org  U.S. Department of Health and Human Services, Office on Women's Health: www.womenshealth.gov/ or https://www.womenshealth.gov/a-z-topics/bacterial-vaginosis Contact a health care provider if:  Your symptoms do not improve, even after treatment.  You have more discharge or pain when urinating.  You have a fever.  You have pain in your abdomen.  You have pain during sex.  You have vaginal bleeding between periods. Summary  Bacterial vaginosis is a vaginal infection that occurs when the normal balance of bacteria in the vagina is disrupted.  Because bacterial vaginosis increases your risk for STIs (sexually transmitted infections), getting treated can help reduce your risk for chlamydia, gonorrhea, herpes, and HIV (human immunodeficiency virus). Treatment is also important for preventing complications in pregnant women, because the condition can cause an early (premature) delivery.  This condition is treated with antibiotic medicines. These may be given as a pill, a vaginal cream, or a medicine that is put into the vagina (suppository). This information is not intended to replace advice given to you by your health care provider. Make sure you discuss any questions you have with your health care provider. Document Released: 08/02/2005 Document Revised: 04/17/2016 Document Reviewed: 04/17/2016 Elsevier Interactive Patient Education  2017 [employer]  

## 2016-07-30 LAB — COMPREHENSIVE METABOLIC PANEL
A/G RATIO: 1.3 (ref 1.2–2.2)
ALT: 14 IU/L (ref 0–32)
AST: 17 IU/L (ref 0–40)
Albumin: 4.4 g/dL (ref 3.5–5.5)
Alkaline Phosphatase: 47 IU/L (ref 39–117)
BUN/Creatinine Ratio: 16 (ref 9–23)
BUN: 13 mg/dL (ref 6–20)
Bilirubin Total: <0.2 mg/dL (ref 0.0–1.2)
CALCIUM: 9.3 mg/dL (ref 8.7–10.2)
CO2: 24 mmol/L (ref 18–29)
CREATININE: 0.79 mg/dL (ref 0.57–1.00)
Chloride: 100 mmol/L (ref 96–106)
GFR, EST AFRICAN AMERICAN: 112 mL/min/{1.73_m2} (ref >59)
GFR, EST NON AFRICAN AMERICAN: 97 mL/min/{1.73_m2} (ref >59)
GLOBULIN, TOTAL: 3.4 g/dL (ref 1.5–4.5)
Glucose: 75 mg/dL (ref 65–99)
POTASSIUM: 4.2 mmol/L (ref 3.5–5.2)
Sodium: 139 mmol/L (ref 134–144)
TOTAL PROTEIN: 7.8 g/dL (ref 6.0–8.5)

## 2016-07-30 LAB — CBC
Hematocrit: 35.1 % (ref 34.0–46.6)
Hemoglobin: 11.9 g/dL (ref 11.1–15.9)
MCH: 29.3 pg (ref 26.6–33.0)
MCHC: 33.9 g/dL (ref 31.5–35.7)
MCV: 87 fL (ref 79–97)
PLATELETS: 383 10*3/uL — AB (ref 150–379)
RBC: 4.06 x10E6/uL (ref 3.77–5.28)
RDW: 14 % (ref 12.3–15.4)
WBC: 5.4 10*3/uL (ref 3.4–10.8)

## 2016-07-30 LAB — TSH: TSH: 0.757 u[IU]/mL (ref 0.450–4.500)

## 2016-07-30 LAB — HIV ANTIBODY (ROUTINE TESTING W REFLEX): HIV SCREEN 4TH GENERATION: NONREACTIVE

## 2016-07-30 LAB — HEPATITIS C ANTIBODY

## 2016-07-30 LAB — RPR: RPR Ser Ql: NONREACTIVE

## 2016-07-30 LAB — HEPATITIS B SURFACE ANTIGEN: HEP B S AG: NEGATIVE

## 2016-08-02 ENCOUNTER — Telehealth: Payer: Self-pay

## 2016-08-02 NOTE — Telephone Encounter (Signed)
Returned call and advised of results 

## 2016-08-04 LAB — NUSWAB VG+, CANDIDA 6SP
CANDIDA KRUSEI, NAA: NEGATIVE
CANDIDA LUSITANIAE, NAA: NEGATIVE
CANDIDA PARAPSILOSIS, NAA: NEGATIVE
CANDIDA TROPICALIS, NAA: NEGATIVE
Candida albicans, NAA: NEGATIVE
Candida glabrata, NAA: NEGATIVE
Chlamydia trachomatis, NAA: NEGATIVE
Neisseria gonorrhoeae, NAA: NEGATIVE
TRICH VAG BY NAA: NEGATIVE

## 2016-08-06 LAB — CYTOLOGY - PAP
DIAGNOSIS: NEGATIVE
HPV (WINDOPATH): DETECTED — AB

## 2016-10-06 ENCOUNTER — Ambulatory Visit: Payer: Managed Care, Other (non HMO) | Admitting: Interventional Cardiology

## 2016-10-06 ENCOUNTER — Telehealth: Payer: Self-pay

## 2016-10-06 NOTE — Telephone Encounter (Signed)
Sent notes to scheduling 

## 2016-10-21 ENCOUNTER — Encounter: Payer: Self-pay | Admitting: Interventional Cardiology

## 2016-10-28 ENCOUNTER — Encounter: Payer: Self-pay | Admitting: Interventional Cardiology

## 2016-10-28 ENCOUNTER — Ambulatory Visit (INDEPENDENT_AMBULATORY_CARE_PROVIDER_SITE_OTHER): Payer: Managed Care, Other (non HMO) | Admitting: Interventional Cardiology

## 2016-10-28 VITALS — BP 116/82 | HR 86 | Ht 64.5 in | Wt 151.8 lb

## 2016-10-28 DIAGNOSIS — Z136 Encounter for screening for cardiovascular disorders: Secondary | ICD-10-CM | POA: Diagnosis not present

## 2016-10-28 DIAGNOSIS — R002 Palpitations: Secondary | ICD-10-CM

## 2016-10-28 DIAGNOSIS — R072 Precordial pain: Secondary | ICD-10-CM | POA: Diagnosis not present

## 2016-10-28 NOTE — Patient Instructions (Signed)
Your physician recommends that you continue on your current medications as directed. Please refer to the Current Medication list given to you today. Your physician recommends that you schedule a follow-up appointment as needed with Dr. Varanasi.   
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
## 2016-12-22 ENCOUNTER — Other Ambulatory Visit: Payer: Self-pay | Admitting: Obstetrics

## 2016-12-22 ENCOUNTER — Telehealth: Payer: Self-pay | Admitting: *Deleted

## 2016-12-22 DIAGNOSIS — B9689 Other specified bacterial agents as the cause of diseases classified elsewhere: Secondary | ICD-10-CM

## 2016-12-22 DIAGNOSIS — N760 Acute vaginitis: Principal | ICD-10-CM

## 2016-12-22 MED ORDER — METRONIDAZOLE 0.75 % VA GEL: 1.0000 | Freq: Two times a day (BID) | VAGINAL | 4 refills | Status: DC

## 2016-12-22 NOTE — Telephone Encounter (Signed)
Fax from pharmacy for refill on [employer] with refills.   Please send Rx to pharmacy if approved.

## 2016-12-22 NOTE — Telephone Encounter (Signed)
[employer] Rx 

## 2017-02-08 ENCOUNTER — Other Ambulatory Visit: Payer: Self-pay | Admitting: Obstetrics

## 2017-02-08 DIAGNOSIS — Z1231 Encounter for screening mammogram for malignant neoplasm of breast: Secondary | ICD-10-CM

## 2017-02-17 ENCOUNTER — Ambulatory Visit (INDEPENDENT_AMBULATORY_CARE_PROVIDER_SITE_OTHER): Payer: 59 | Admitting: Obstetrics

## 2017-02-17 ENCOUNTER — Encounter: Payer: Self-pay | Admitting: Obstetrics

## 2017-02-17 VITALS — BP 96/63 | HR 87 | Ht 64.0 in | Wt 152.8 lb

## 2017-02-17 DIAGNOSIS — N898 Other specified noninflammatory disorders of vagina: Secondary | ICD-10-CM

## 2017-02-17 DIAGNOSIS — Z113 Encounter for screening for infections with a predominantly sexual mode of transmission: Secondary | ICD-10-CM | POA: Diagnosis not present

## 2017-02-17 MED ORDER — METRONIDAZOLE 500 MG PO TABS: 500.0000 mg | ORAL_TABLET | Freq: Two times a day (BID) | ORAL | 2 refills | Status: DC

## 2017-02-17 NOTE — Addendum Note (Signed)
Addended by: Natale MilchSTALLING, Alie Moudy D on: 02/17/2017 11:03 AM   Modules accepted: Orders

## 2017-02-17 NOTE — Progress Notes (Signed)
Patient ID: Dana Escobar, female   DOB: Mar 11, 1981, 36 y.o.   MRN: [id number]  Chief Complaint  Patient presents with  . Vaginal Discharge    HPI Dana Escobar is a 36 y.o. female.  Vaginal discharge with slight odor. HPI  Past Medical History:  Diagnosis Date  . Depression 02/28/2012  . Fibromyalgia     History reviewed. No pertinent surgical history.  Family History  Problem Relation Age of Onset  . Breast cancer Mother 9  . Breast cancer Unknown   . Breast cancer Maternal Grandmother   . Hypertension Maternal Grandfather   . Diabetes Paternal Grandmother     Social History Social History  Substance Use Topics  . Smoking status: Never Smoker  . Smokeless tobacco: Never Used  . Alcohol use 0.0 oz/week     Comment: ocassionally    No Known Allergies  Current Outpatient Prescriptions  Medication Sig Dispense Refill  . acetaminophen (TYLENOL) 500 MG tablet Take 1,000 mg by mouth every 6 (six) hours as needed for moderate pain.    Marland Kitchen ALPRAZolam (XANAX) 0.5 MG tablet Take 1 tablet (0.5 mg total) by mouth at bedtime as needed for anxiety. 30 tablet 2  . Lysine 500 MG CAPS Take 1 capsule by mouth daily.    . cyclobenzaprine (FLEXERIL) 10 MG tablet Take 1 tablet (10 mg total) by mouth every 8 (eight) hours as needed for muscle spasms. (Patient not taking: Reported on 02/17/2017) 42 tablet 2  . metroNIDAZOLE (FLAGYL) 500 MG tablet Take 1 tablet (500 mg total) by mouth 2 (two) times daily. 14 tablet 2  . Multiple Vitamin (MULTIVITAMIN) capsule Take 1 capsule by mouth daily.    . traMADol (ULTRAM) 50 MG tablet Take 1 tablet (50 mg total) by mouth every 6 (six) hours as needed for moderate pain. (Patient not taking: Reported on 02/17/2017) 30 tablet 4   No current facility-administered medications for this visit.     Review of Systems Review of Systems Constitutional: negative for fatigue and weight loss Respiratory: negative for cough and wheezing Cardiovascular: negative  for chest pain, fatigue and palpitations Gastrointestinal: negative for abdominal pain and change in bowel habits Genitourinary:positive for vaginal discharge with slight odor Integument/breast: negative for nipple discharge Musculoskeletal:negative for myalgias Neurological: negative for gait problems and tremors Behavioral/Psych: negative for abusive relationship, depression Endocrine: negative for temperature intolerance      Blood pressure 96/63, pulse 87, height 5\' 4"  (1.626 m), weight 152 lb 12.8 oz (69.3 kg), last menstrual period 02/11/2017.  Physical Exam Physical Exam           General:  Alert and no distress Abdomen:  normal findings: no organomegaly, soft, non-tender and no hernia  Pelvis:  External genitalia: normal general appearance Urinary system: urethral meatus normal and bladder without fullness, nontender Vaginal: normal without tenderness, induration or masses Cervix: normal appearance Adnexa: normal bimanual exam Uterus: anteverted and non-tender, normal size    50% of 15 min visit spent on counseling and coordination of care.    Data Reviewed Wet prep  Assessment     1. Vaginal discharge Rx: - Cervicovaginal ancillary only - metroNIDAZOLE (FLAGYL) 500 MG tablet; Take 1 tablet (500 mg total) by mouth 2 (two) times daily.  Dispense: 14 tablet; Refill: 2  2. Screen for STD (sexually transmitted disease) Rx: - STD Panel (HBSAG,HIV,RPR)    Plan    Follow up prn  Orders Placed This Encounter  Procedures  . STD Panel (HBSAG,HIV,RPR)  Meds ordered this encounter  Medications  . metroNIDAZOLE (FLAGYL) 500 MG tablet    Sig: Take 1 tablet (500 mg total) by mouth 2 (two) times daily.    Dispense:  14 tablet    Refill:  2

## 2017-02-17 NOTE — Progress Notes (Signed)
Pt complains of having vaginal discharge that started after her cycle. States that there is a slight odor, and is runny.

## 2017-02-18 LAB — CERVICOVAGINAL ANCILLARY ONLY
BACTERIAL VAGINITIS: NEGATIVE
CHLAMYDIA, DNA PROBE: NEGATIVE
Candida vaginitis: NEGATIVE
NEISSERIA GONORRHEA: NEGATIVE
TRICH (WINDOWPATH): NEGATIVE

## 2017-02-19 LAB — RPR+HSVIGM+HBSAG+HSV2(IGG)+...
HEP B S AG: NEGATIVE
HIV Screen 4th Generation wRfx: NONREACTIVE
HSV 2 Glycoprotein G Ab, IgG: 3.52 index — ABNORMAL HIGH (ref 0.00–0.90)
HSVI/II Comb IgM: <0.91 Ratio (ref 0.00–0.90)
RPR Ser Ql: NONREACTIVE

## 2017-02-21 ENCOUNTER — Other Ambulatory Visit: Payer: Self-pay

## 2017-03-14 ENCOUNTER — Ambulatory Visit
Admission: RE | Admit: 2017-03-14 | Discharge: 2017-03-14 | Disposition: A | Discharge status: Home / Self Care | Payer: 59 | Source: Ambulatory Visit | Attending: Obstetrics | Admitting: Obstetrics

## 2017-03-14 DIAGNOSIS — Z1231 Encounter for screening mammogram for malignant neoplasm of breast: Secondary | ICD-10-CM

## 2017-03-16 ENCOUNTER — Other Ambulatory Visit: Payer: Self-pay | Admitting: Obstetrics

## 2017-03-16 DIAGNOSIS — R928 Other abnormal and inconclusive findings on diagnostic imaging of breast: Secondary | ICD-10-CM

## 2017-03-18 ENCOUNTER — Ambulatory Visit
Admission: RE | Admit: 2017-03-18 | Discharge: 2017-03-18 | Disposition: A | Discharge status: Home / Self Care | Payer: 59 | Source: Ambulatory Visit | Attending: Obstetrics | Admitting: Obstetrics

## 2017-03-18 DIAGNOSIS — R928 Other abnormal and inconclusive findings on diagnostic imaging of breast: Secondary | ICD-10-CM

## 2017-04-28 ENCOUNTER — Ambulatory Visit: Payer: 59 | Admitting: Obstetrics

## 2017-08-01 ENCOUNTER — Ambulatory Visit (INDEPENDENT_AMBULATORY_CARE_PROVIDER_SITE_OTHER): Payer: 59 | Admitting: Obstetrics

## 2017-08-01 ENCOUNTER — Encounter: Payer: Self-pay | Admitting: Obstetrics

## 2017-08-01 VITALS — BP 124/82 | HR 83 | Ht 64.0 in | Wt 156.6 lb

## 2017-08-01 DIAGNOSIS — Z1151 Encounter for screening for human papillomavirus (HPV): Secondary | ICD-10-CM | POA: Diagnosis not present

## 2017-08-01 DIAGNOSIS — Z1501 Genetic susceptibility to malignant neoplasm of breast: Secondary | ICD-10-CM

## 2017-08-01 DIAGNOSIS — Z23 Encounter for immunization: Secondary | ICD-10-CM | POA: Diagnosis not present

## 2017-08-01 DIAGNOSIS — Z124 Encounter for screening for malignant neoplasm of cervix: Secondary | ICD-10-CM

## 2017-08-01 DIAGNOSIS — Z01419 Encounter for gynecological examination (general) (routine) without abnormal findings: Secondary | ICD-10-CM

## 2017-08-01 DIAGNOSIS — F411 Generalized anxiety disorder: Secondary | ICD-10-CM

## 2017-08-01 DIAGNOSIS — N898 Other specified noninflammatory disorders of vagina: Secondary | ICD-10-CM

## 2017-08-01 DIAGNOSIS — Z113 Encounter for screening for infections with a predominantly sexual mode of transmission: Secondary | ICD-10-CM | POA: Diagnosis not present

## 2017-08-01 MED ORDER — FLUCONAZOLE 150 MG PO TABS: 150.0000 mg | ORAL_TABLET | Freq: Once | ORAL | 2 refills | Status: AC

## 2017-08-01 MED ORDER — ALPRAZOLAM 0.5 MG PO TABS: 0.5000 mg | ORAL_TABLET | Freq: Every evening | ORAL | 2 refills | Status: DC | PRN

## 2017-08-01 MED ORDER — CLINDAMYCIN PHOSPHATE (1 DOSE) 2 % VA CREA: TOPICAL_CREAM | VAGINAL | 5 refills | Status: DC

## 2017-08-01 NOTE — Progress Notes (Signed)
Subjective:        Dana Escobar is a 36 y.o. female here for a routine exam.  Current complaints: None.    Personal health questionnaire:  Is patient [name], have a family history of breast and/or ovarian cancer: no Is there a family history of uterine cancer diagnosed at age < 1250, gastrointestinal cancer, urinary tract cancer, family member who is a Personnel officerLynch syndrome-associated carrier: no Is the patient overweight and hypertensive, family history of diabetes, personal history of gestational diabetes, preeclampsia or PCOS: no Is patient over 6855, have PCOS,  family history of premature CHD under age 36, diabetes, smoke, have hypertension or peripheral artery disease:  no At any time, has a partner hit, kicked or otherwise hurt or frightened you?: no Over the past 2 weeks, have you felt down, depressed or hopeless?: no Over the past 2 weeks, have you felt little interest or pleasure in doing things?:no   Gynecologic History Patient's last menstrual period was 07/17/2017. Contraception: condoms Last Pap: 2017. Results were: normal Last mammogram: 2018. Results were: abnormal asymmetry ( Breast U/S normal )  Obstetric History OB History  Gravida Para Term Preterm AB Living  2 1 1   1 1   SAB TAB Ectopic Multiple Live Births    1     1    # Outcome Date GA Lbr Len/2nd Weight Sex Delivery Anes PTL Lv  2 TAB           1 Term      Vag-Spont   LIV      Past Medical History:  Diagnosis Date  . Depression 02/28/2012  . Fibromyalgia     History reviewed. No pertinent surgical history.   Current Outpatient Medications:  .  acetaminophen (TYLENOL) 500 MG tablet, Take 1,000 mg by mouth every 6 (six) hours as needed for moderate pain., Disp: , Rfl:  .  ALPRAZolam (XANAX) 0.5 MG tablet, Take 1 tablet (0.5 mg total) by mouth at bedtime as needed for anxiety., Disp: 30 tablet, Rfl: 2 .  Lysine 500 MG CAPS, Take 1 capsule by mouth daily., Disp: , Rfl:  .  Multiple Vitamin  (MULTIVITAMIN) capsule, Take 1 capsule by mouth daily., Disp: , Rfl:  .  cyclobenzaprine (FLEXERIL) 10 MG tablet, Take 1 tablet (10 mg total) by mouth every 8 (eight) hours as needed for muscle spasms. (Patient not taking: Reported on 02/17/2017), Disp: 42 tablet, Rfl: 2 .  metroNIDAZOLE (FLAGYL) 500 MG tablet, Take 1 tablet (500 mg total) by mouth 2 (two) times daily. (Patient not taking: Reported on 08/01/2017), Disp: 14 tablet, Rfl: 2 .  traMADol (ULTRAM) 50 MG tablet, Take 1 tablet (50 mg total) by mouth every 6 (six) hours as needed for moderate pain. (Patient not taking: Reported on 02/17/2017), Disp: 30 tablet, Rfl: 4 No Known Allergies  Social History   Tobacco Use  . Smoking status: Never Smoker  . Smokeless tobacco: Never Used  Substance Use Topics  . Alcohol use: Yes    Alcohol/week: 0.0 oz    Comment: ocassionally    Family History  Problem Relation Age of Onset  . Breast cancer Mother 6232  . Breast cancer Unknown   . Breast cancer Maternal Grandmother   . Hypertension Maternal Grandfather   . Diabetes Paternal Grandmother       Review of Systems  Constitutional: negative for fatigue and weight loss Respiratory: negative for cough and wheezing Cardiovascular: negative for chest pain, fatigue and palpitations Gastrointestinal: negative  for abdominal pain and change in bowel habits Musculoskeletal:negative for myalgias Neurological: negative for gait problems and tremors Behavioral/Psych: negative for abusive relationship, depression Endocrine: negative for temperature intolerance    Genitourinary:negative for abnormal menstrual periods, genital lesions, hot flashes, sexual problems and vaginal discharge Integument/breast: negative for breast lump, breast tenderness, nipple discharge and skin lesion(s)    Objective:       BP 124/82   Pulse 83   Ht 5\' 4"  (1.626 m)   Wt 156 lb 9.6 oz (71 kg)   LMP 07/17/2017   BMI 26.88 kg/m  General:   alert  Skin:   no rash or  abnormalities  Lungs:   clear to auscultation bilaterally  Heart:   regular rate and rhythm, S1, S2 normal, no murmur, click, rub or gallop  Breasts:   normal without suspicious masses, skin or nipple changes or axillary nodes  Abdomen:  normal findings: no organomegaly, soft, non-tender and no hernia  Pelvis:  External genitalia: normal general appearance Urinary system: urethral meatus normal and bladder without fullness, nontender Vaginal: normal without tenderness, induration or masses Cervix: normal appearance Adnexa: normal bimanual exam Uterus: anteverted and non-tender, normal size   Lab Review Urine pregnancy test Labs reviewed yes Radiologic studies reviewed yes  50% of 20 min visit spent on counseling and coordination of care.   Assessment:      1. Encounter for routine gynecological examination with Papanicolaou smear of cervix Rx: - Flu Vaccine QUAD 36+ mos IM (Fluarix, Quad PF) - Cytology - PAP - Cervicovaginal ancillary only  2. Vaginal discharge Rx: - Clindamycin Phosphate, 1 Dose, (CLINDESSE) vaginal cream; One applicator full  intravaginally once at bedtime.  Dispense: 5.8 g; Refill: 5 - fluconazole (DIFLUCAN) 150 MG tablet; Take 1 tablet (150 mg total) by mouth once for 1 dose.  Dispense: 1 tablet; Refill: 2  3. Screen for STD (sexually transmitted disease) Rx: - HIV antibody (with reflex) - RPR - Hepatitis B Surface AntiGEN - Hepatitis C Antibody  4. Generalized anxiety disorder Rx: - ALPRAZolam (XANAX) 0.5 MG tablet; Take 1 tablet (0.5 mg total) by mouth at bedtime as needed for anxiety.  Dispense: 30 tablet; Refill: 2  5. Breast cancer genetic susceptibility - yearly mammograms   Plan:    Education reviewed: calcium supplements, depression evaluation, low fat, low cholesterol diet, safe sex/STD prevention, self breast exams and weight bearing exercise. Contraception: condoms. Follow up in: 1 year.   No orders of the defined types were placed  in this encounter.  No orders of the defined types were placed in this encounter.

## 2017-08-01 NOTE — Progress Notes (Signed)
Patient is in the office for annual exam. Last pap 07-29-16. Pt requests full screen std testing and wants flu vaccine.

## 2017-08-02 LAB — RPR: RPR: NONREACTIVE

## 2017-08-02 LAB — CERVICOVAGINAL ANCILLARY ONLY
Bacterial vaginitis: NEGATIVE
CHLAMYDIA, DNA PROBE: NEGATIVE
Candida vaginitis: NEGATIVE
NEISSERIA GONORRHEA: NEGATIVE
Trichomonas: NEGATIVE

## 2017-08-02 LAB — CYTOLOGY - PAP
Diagnosis: NEGATIVE
HPV: NOT DETECTED

## 2017-08-02 LAB — HIV ANTIBODY (ROUTINE TESTING W REFLEX): HIV Screen 4th Generation wRfx: NONREACTIVE

## 2017-08-02 LAB — HEPATITIS B SURFACE ANTIGEN: Hepatitis B Surface Ag: NEGATIVE

## 2017-08-02 LAB — HEPATITIS C ANTIBODY: Hep C Virus Ab: 0.1 s/co ratio (ref 0.0–0.9)

## 2017-10-24 ENCOUNTER — Ambulatory Visit: Payer: 59 | Admitting: Obstetrics

## 2017-10-24 ENCOUNTER — Encounter: Payer: Self-pay | Admitting: Obstetrics

## 2017-10-24 VITALS — BP 120/82 | HR 87 | Wt 163.4 lb

## 2017-10-24 DIAGNOSIS — M545 Low back pain, unspecified: Secondary | ICD-10-CM

## 2017-10-24 DIAGNOSIS — R102 Pelvic and perineal pain: Secondary | ICD-10-CM | POA: Diagnosis not present

## 2017-10-24 DIAGNOSIS — R300 Dysuria: Secondary | ICD-10-CM | POA: Diagnosis not present

## 2017-10-24 DIAGNOSIS — Z113 Encounter for screening for infections with a predominantly sexual mode of transmission: Secondary | ICD-10-CM

## 2017-10-24 LAB — POCT URINALYSIS DIPSTICK
BILIRUBIN UA: NEGATIVE
GLUCOSE UA: NEGATIVE
Ketones, UA: NEGATIVE
Leukocytes, UA: NEGATIVE
Nitrite, UA: NEGATIVE
Protein, UA: NEGATIVE
Spec Grav, UA: 1.02 (ref 1.010–1.025)
Urobilinogen, UA: 0.2 E.U./dL
pH, UA: 6 (ref 5.0–8.0)

## 2017-10-24 MED ORDER — IBUPROFEN 800 MG PO TABS: 800.0000 mg | ORAL_TABLET | Freq: Three times a day (TID) | ORAL | 5 refills | Status: DC | PRN

## 2017-10-24 NOTE — Progress Notes (Signed)
RGYN patient presents for problem visit today. CC: Urine Urgency +Pain and Pressure, dysuria noticed blood in urine as well.Sx's are worse at night. Pt has tried OTC medication (AZO) and Antibiotics she had left from previous problem it only stopped patient bleeding.

## 2017-10-24 NOTE — Progress Notes (Signed)
Patient ID: Dana Escobar, female   DOB: 09-25-1980, 37 y.o.   MRN: [id number]  Chief Complaint  Patient presents with  . Urinary Tract Infection    HPI Dana Escobar is a 37 y.o. female.  Urinary frequency, urgency and backache.  Also notes blood in urine when wiping. HPI  Past Medical History:  Diagnosis Date  . Depression 02/28/2012  . Fibromyalgia     No past surgical history on file.  Family History  Problem Relation Age of Onset  . Breast cancer Mother 4932  . Breast cancer Unknown   . Breast cancer Maternal Grandmother   . Hypertension Maternal Grandfather   . Diabetes Paternal Grandmother     Social History Social History   Tobacco Use  . Smoking status: Never Smoker  . Smokeless tobacco: Never Used  Substance Use Topics  . Alcohol use: Yes    Alcohol/week: 0.0 oz    Comment: ocassionally  . Drug use: No    No Known Allergies  Current Outpatient Medications  Medication Sig Dispense Refill  . acetaminophen (TYLENOL) 500 MG tablet Take 1,000 mg by mouth every 6 (six) hours as needed for moderate pain.    Marland Kitchen. ALPRAZolam (XANAX) 0.5 MG tablet Take 1 tablet (0.5 mg total) by mouth at bedtime as needed for anxiety. 30 tablet 2  . Clindamycin Phosphate, 1 Dose, (CLINDESSE) vaginal cream One applicator full  intravaginally once at bedtime. 5.8 g 5  . cyclobenzaprine (FLEXERIL) 10 MG tablet Take 1 tablet (10 mg total) by mouth every 8 (eight) hours as needed for muscle spasms. (Patient not taking: Reported on 02/17/2017) 42 tablet 2  . ibuprofen (ADVIL,MOTRIN) 800 MG tablet Take 1 tablet (800 mg total) by mouth every 8 (eight) hours as needed. 30 tablet 5  . Lysine 500 MG CAPS Take 1 capsule by mouth daily.    . metroNIDAZOLE (FLAGYL) 500 MG tablet Take 1 tablet (500 mg total) by mouth 2 (two) times daily. (Patient not taking: Reported on 08/01/2017) 14 tablet 2  . Multiple Vitamin (MULTIVITAMIN) capsule Take 1 capsule by mouth daily.    . traMADol (ULTRAM) 50 MG tablet  Take 1 tablet (50 mg total) by mouth every 6 (six) hours as needed for moderate pain. (Patient not taking: Reported on 02/17/2017) 30 tablet 4   No current facility-administered medications for this visit.     Review of Systems Review of Systems Constitutional: negative for fatigue and weight loss Respiratory: negative for cough and wheezing Cardiovascular: negative for chest pain, fatigue and palpitations Gastrointestinal: negative for abdominal pain and change in bowel habits Genitourinary:negative Integument/breast: negative for nipple discharge Musculoskeletal:negative for myalgias Neurological: negative for gait problems and tremors Behavioral/Psych: negative for abusive relationship, depression Endocrine: negative for temperature intolerance      Blood pressure 120/82, pulse 87, weight 163 lb 6.4 oz (74.1 kg).  Physical Exam Physical Exam           General: Alert and no distress Abdomen:  normal findings: no organomegaly, soft, non-tender and no hernia  Pelvis:  External genitalia: normal general appearance Urinary system: urethral meatus normal and bladder without fullness, nontender Vaginal: normal without tenderness, induration or masses Cervix: normal appearance Adnexa: normal bimanual exam Uterus: anteverted and non-tender, normal size    50% of 15 min visit spent on counseling and coordination of care.   Data Reviewed Urinalysis Wet Prep  Assessment     1. Dysuria Rx: - Urine Culture - POCT Urinalysis Dipstick  2. Pelvic  pain Rx: - Cervicovaginal ancillary only  3. Acute midline low back pain without sciatica Rx: - ibuprofen (ADVIL,MOTRIN) 800 MG tablet; Take 1 tablet (800 mg total) by mouth every 8 (eight) hours as needed.  Dispense: 30 tablet; Refill: 5    Plan    Follow up prn   Orders Placed This Encounter  Procedures  . Urine Culture  . POCT Urinalysis Dipstick   Meds ordered this encounter  Medications  . ibuprofen (ADVIL,MOTRIN) 800 MG  tablet    Sig: Take 1 tablet (800 mg total) by mouth every 8 (eight) hours as needed.    Dispense:  30 tablet    Refill:  5    Brock Bad MD

## 2017-10-26 LAB — CERVICOVAGINAL ANCILLARY ONLY
BACTERIAL VAGINITIS: NEGATIVE
CANDIDA VAGINITIS: NEGATIVE
Chlamydia: NEGATIVE
NEISSERIA GONORRHEA: NEGATIVE
TRICH (WINDOWPATH): NEGATIVE

## 2017-10-26 LAB — URINE CULTURE: ORGANISM ID, BACTERIA: NO GROWTH

## 2017-12-12 ENCOUNTER — Encounter: Payer: Self-pay | Admitting: Obstetrics

## 2017-12-12 ENCOUNTER — Ambulatory Visit: Payer: 59 | Admitting: Obstetrics

## 2017-12-12 VITALS — BP 131/84 | HR 96 | Temp 98.9°F | Resp 16 | Wt 165.2 lb

## 2017-12-12 DIAGNOSIS — F411 Generalized anxiety disorder: Secondary | ICD-10-CM

## 2017-12-12 DIAGNOSIS — Z113 Encounter for screening for infections with a predominantly sexual mode of transmission: Secondary | ICD-10-CM

## 2017-12-12 DIAGNOSIS — N939 Abnormal uterine and vaginal bleeding, unspecified: Secondary | ICD-10-CM

## 2017-12-12 DIAGNOSIS — B9689 Other specified bacterial agents as the cause of diseases classified elsewhere: Secondary | ICD-10-CM

## 2017-12-12 DIAGNOSIS — N760 Acute vaginitis: Secondary | ICD-10-CM

## 2017-12-12 MED ORDER — SECNIDAZOLE 2 G PO PACK: 1.0000 | PACK | Freq: Once | ORAL | 2 refills | Status: AC

## 2017-12-12 MED ORDER — ALPRAZOLAM 0.5 MG PO TABS: 0.5000 mg | ORAL_TABLET | Freq: Every evening | ORAL | 2 refills | Status: DC | PRN

## 2017-12-12 NOTE — Progress Notes (Signed)
Patient ID: Dana Escobar, female   DOB: [birth year], 36 y.o.   MRN: [id number]  Chief Complaint  Patient presents with  . Vaginal Bleeding    Irregular bleeding    HPI Dana Escobar is a 37 y.o. female.  Irregular vaginal spotting. HPI  Past Medical History:  Diagnosis Date  . Depression 02/28/2012  . Fibromyalgia     History reviewed. No pertinent surgical history.  Family History  Problem Relation Age of Onset  . Breast cancer Mother 13  . Breast cancer Unknown   . Breast cancer Maternal Grandmother   . Hypertension Maternal Grandfather   . Diabetes Paternal Grandmother     Social History Social History   Tobacco Use  . Smoking status: Never Smoker  . Smokeless tobacco: Never Used  Substance Use Topics  . Alcohol use: Yes    Alcohol/week: 0.0 oz    Comment: ocassionally  . Drug use: No    No Known Allergies  Current Outpatient Medications  Medication Sig Dispense Refill  . acetaminophen (TYLENOL) 500 MG tablet Take 1,000 mg by mouth every 6 (six) hours as needed for moderate pain.    Marland Kitchen ALPRAZolam (XANAX) 0.5 MG tablet Take 1 tablet (0.5 mg total) by mouth at bedtime as needed for anxiety. 30 tablet 2  . Clindamycin Phosphate, 1 Dose, (CLINDESSE) vaginal cream One applicator full  intravaginally once at bedtime. 5.8 g 5  . ibuprofen (ADVIL,MOTRIN) 800 MG tablet Take 1 tablet (800 mg total) by mouth every 8 (eight) hours as needed. 30 tablet 5  . Lysine 500 MG CAPS Take 1 capsule by mouth daily.    . Multiple Vitamin (MULTIVITAMIN) capsule Take 1 capsule by mouth daily.     No current facility-administered medications for this visit.     Review of Systems Review of Systems Constitutional: negative for fatigue and weight loss Respiratory: negative for cough and wheezing Cardiovascular: negative for chest pain, fatigue and palpitations Gastrointestinal: negative for abdominal pain and change in bowel habits Genitourinary:positive for irregular vaginal  spotting Integument/breast: negative for nipple discharge Musculoskeletal:negative for myalgias Neurological: negative for gait problems and tremors Behavioral/Psych: negative for abusive relationship, depression Endocrine: negative for temperature intolerance      Blood pressure 134/90, pulse 91, temperature 98.9 F (37.2 C), temperature source Oral, resp. rate 16, weight 165 lb 3.2 oz (74.9 kg), last menstrual period 11/28/2017.  Physical Exam Physical Exam           General:  Alert and no distress Abdomen:  normal findings: no organomegaly, soft, non-tender and no hernia  Pelvis:  External genitalia: normal general appearance Urinary system: urethral meatus normal and bladder without fullness, nontender Vaginal: normal without tenderness, induration or masses Cervix: normal appearance Adnexa: normal bimanual exam Uterus: anteverted and non-tender, normal size    50% of 20 min visit spent on counseling and coordination of care.   Data Reviewed Wet Prep Cultures  Assessment     1. Abnormal uterine bleeding (AUB) Rx: - US PELVIC COMPLETE WITH TRANSVAGINAL; Future  2. BV (bacterial vaginosis) Rx: - Secnidazole (SOLOSEC) 2 g PACK; Take 1 packet by mouth once for 1 dose. Mix as directed with yogurt, pudding, applesauce, etc.  Dispense: 1 each; Refill: 2  3. Generalized anxiety disorder Rx: - ALPRAZolam (XANAX) 0.5 MG tablet; Take 1 tablet (0.5 mg total) by mouth at bedtime as needed for anxiety.  Dispense: 30 tablet; Refill: 2    Plan    Orders Placed This Encounter  Procedures  .  US PELVIC COMPLETE WITH TRANSVAGINAL    Standing Status:   Future    Standing Expiration Date:   02/12/2019    Order Specific Question:   Reason for Exam (SYMPTOM  OR DIAGNOSIS REQUIRED)    Answer:   AUB    Order Specific Question:   Preferred imaging location?    Answer:   [facility]   No orders of the defined types were placed in this encounter.    Brock Bad  MD 12-12-2017

## 2017-12-12 NOTE — Addendum Note (Signed)
Addended by: Areta Haber B on: 12/12/2017 04:52 PM   Modules accepted: Orders

## 2017-12-14 LAB — CERVICOVAGINAL ANCILLARY ONLY
BACTERIAL VAGINITIS: NEGATIVE
CANDIDA VAGINITIS: NEGATIVE
Chlamydia: NEGATIVE
Neisseria Gonorrhea: NEGATIVE
Trichomonas: NEGATIVE

## 2017-12-20 ENCOUNTER — Ambulatory Visit
Admission: RE | Admit: 2017-12-20 | Discharge: 2017-12-20 | Disposition: A | Discharge status: Home / Self Care | Payer: 59 | Source: Ambulatory Visit | Attending: Obstetrics | Admitting: Obstetrics

## 2017-12-20 DIAGNOSIS — N939 Abnormal uterine and vaginal bleeding, unspecified: Secondary | ICD-10-CM

## 2017-12-26 ENCOUNTER — Other Ambulatory Visit (HOSPITAL_COMMUNITY)
Admission: RE | Admit: 2017-12-26 | Discharge: 2017-12-26 | Disposition: A | Discharge status: Home / Self Care | Payer: 59 | Source: Ambulatory Visit | Attending: Obstetrics | Admitting: Obstetrics

## 2017-12-26 ENCOUNTER — Encounter: Payer: Self-pay | Admitting: Obstetrics

## 2017-12-26 ENCOUNTER — Ambulatory Visit (INDEPENDENT_AMBULATORY_CARE_PROVIDER_SITE_OTHER): Payer: 59 | Admitting: Obstetrics

## 2017-12-26 VITALS — BP 121/80 | HR 85 | Ht 65.0 in | Wt 166.4 lb

## 2017-12-26 DIAGNOSIS — R9389 Abnormal findings on diagnostic imaging of other specified body structures: Secondary | ICD-10-CM

## 2017-12-26 DIAGNOSIS — N939 Abnormal uterine and vaginal bleeding, unspecified: Secondary | ICD-10-CM | POA: Diagnosis not present

## 2017-12-26 DIAGNOSIS — Z113 Encounter for screening for infections with a predominantly sexual mode of transmission: Secondary | ICD-10-CM | POA: Diagnosis not present

## 2017-12-26 NOTE — Progress Notes (Signed)
Presents for [place] follow up.  Endo Biopsy. Informed Consent obtained.

## 2017-12-26 NOTE — Progress Notes (Signed)
Patient ID: Dana Escobar, female   DOB: 05/08/1981, 37 y.o.   MRN: [id number]  Chief Complaint  Patient presents with  . Follow-up    HPI Dana Escobar is a 37 y.o. female.  History of AUB-O.  Presents for results of ultrasound. HPI  Past Medical History:  Diagnosis Date  . Depression 02/28/2012  . Fibromyalgia     History reviewed. No pertinent surgical history.  Family History  Problem Relation Age of Onset  . Breast cancer Mother 69  . Breast cancer Unknown   . Breast cancer Maternal Grandmother   . Hypertension Maternal Grandfather   . Diabetes Paternal Grandmother     Social History Social History   Tobacco Use  . Smoking status: Never Smoker  . Smokeless tobacco: Never Used  Substance Use Topics  . Alcohol use: Yes    Alcohol/week: 0.0 oz    Comment: ocassionally  . Drug use: No    No Known Allergies  Current Outpatient Medications  Medication Sig Dispense Refill  . acetaminophen (TYLENOL) 500 MG tablet Take 1,000 mg by mouth every 6 (six) hours as needed for moderate pain.    Marland Kitchen ALPRAZolam (XANAX) 0.5 MG tablet Take 1 tablet (0.5 mg total) by mouth at bedtime as needed for anxiety. 30 tablet 2  . Clindamycin Phosphate, 1 Dose, (CLINDESSE) vaginal cream One applicator full  intravaginally once at bedtime. 5.8 g 5  . ibuprofen (ADVIL,MOTRIN) 800 MG tablet Take 1 tablet (800 mg total) by mouth every 8 (eight) hours as needed. 30 tablet 5  . Lysine 500 MG CAPS Take 1 capsule by mouth daily.    . Multiple Vitamin (MULTIVITAMIN) capsule Take 1 capsule by mouth daily.     No current facility-administered medications for this visit.     Review of Systems Review of Systems Constitutional: negative for fatigue and weight loss Respiratory: negative for cough and wheezing Cardiovascular: negative for chest pain, fatigue and palpitations Gastrointestinal: negative for abdominal pain and change in bowel habits Genitourinary:POSITIVE for AUB Integument/breast:  negative for nipple discharge Musculoskeletal:negative for myalgias Neurological: negative for gait problems and tremors Behavioral/Psych: negative for abusive relationship, depression Endocrine: negative for temperature intolerance      Blood pressure 121/80, pulse 85, height  (1.651 m), weight 166 lb 6.4 oz (75.5 kg), last menstrual period 11/28/2017.  Physical Exam Physical Exam General:   alert  Skin:   no rash or abnormalities  Lungs:   clear to auscultation bilaterally  Heart:   regular rate and rhythm, S1, S2 normal, no murmur, click, rub or gallop  Breasts:   normal without suspicious masses, skin or nipple changes or axillary nodes  Abdomen:  normal findings: no organomegaly, soft, non-tender and no hernia  Pelvis:  External genitalia: normal general appearance Urinary system: urethral meatus normal and bladder without fullness, nontender Vaginal: normal without tenderness, induration or masses Cervix: normal appearance Adnexa: normal bimanual exam Uterus: anteverted and non-tender, normal size    50% of 20 min visit spent on counseling and coordination of care.   Data Reviewed Ultrasound: US PELVIC COMPLETE WITH TRANSVAGINAL (Accession [id number]) (Order [id number])  Imaging  Date: 12/20/2017 Department: [facility] Released By: Peri Jefferson Authorizing: Brock Bad, MD  Exam Information   Status Exam Begun  Exam Ended   Final [99] 12/20/2017 10:46 AM 12/20/2017 11:28 AM  PACS Images   Show images for US PELVIC COMPLETE WITH TRANSVAGINAL  Study Result   CLINICAL DATA:  Patient with dysfunctional uterine bleeding.  EXAM: TRANSABDOMINAL AND TRANSVAGINAL ULTRASOUND OF PELVIS  TECHNIQUE: Both transabdominal and transvaginal ultrasound examinations of the pelvis were performed. Transabdominal technique was performed for global imaging of the pelvis including uterus, ovaries, adnexal regions, and pelvic cul-de-sac. It  was necessary to proceed with endovaginal exam following the transabdominal exam to visualize the endometrium.  COMPARISON:  None  FINDINGS: Uterus  Measurements: 9.3 x 4.0 x 4.7 cm. No fibroids or other mass visualized.  Endometrium  Thickness: 16 mm.  No focal abnormality visualized.  Right ovary  Measurements: 3.0 x 3.1 x 1.9 cm. Normal appearance/no adnexal mass.  Left ovary  Measurements: 4.9 x 2.6 x 3.7 cm. Prominent follicles within the left ovary.  Other findings  Trace fluid in the pelvis  IMPRESSION: Endometrium measures 16 mm. If bleeding remains unresponsive to hormonal or medical therapy, focal lesion work-up with sonohysterogram should be considered. Endometrial biopsy should also be considered in pre-menopausal patients at high risk for endometrial carcinoma. (Ref: Radiological Reasoning: Algorithmic Workup of Abnormal Vaginal Bleeding with Endovaginal Sonography and Sonohysterography. AJR 2008; 119:J47-82)   Electronically Signed   By: Annia Belt M.D.   On: 12/20/2017 12:56      Assessment       1. Abnormal uterine bleeding (AUB) - probable hormonal imbalance  2. Thickened endometrium on Ultrasound Rx: - Endometrial biopsy; Future  3. Screen for STD (sexually transmitted disease) Rx: - HIV antibody - Hepatitis B surface antigen - RPR - Hepatitis C antibody  Plan    Follow up in 2 weeks   Orders Placed This Encounter  Procedures  . Endometrial biopsy    Standing Status:   Future    Standing Expiration Date:   12/27/2018    Order Specific Question:   Procedure Location    Answer:   [facility]  . HIV antibody  . Hepatitis B surface antigen  . RPR  . Hepatitis C antibody   No orders of the defined types were placed in this encounter.   Brock Bad MD 12-26-2017

## 2017-12-27 LAB — RPR: RPR: NONREACTIVE

## 2017-12-27 LAB — HEPATITIS B SURFACE ANTIGEN: HEP B S AG: NEGATIVE

## 2017-12-27 LAB — HEPATITIS C ANTIBODY

## 2017-12-27 LAB — HIV ANTIBODY (ROUTINE TESTING W REFLEX): HIV SCREEN 4TH GENERATION: NONREACTIVE

## 2018-01-11 ENCOUNTER — Encounter: Payer: Self-pay | Admitting: Obstetrics

## 2018-01-11 ENCOUNTER — Ambulatory Visit (INDEPENDENT_AMBULATORY_CARE_PROVIDER_SITE_OTHER): Payer: 59 | Admitting: Obstetrics

## 2018-01-11 VITALS — BP 149/84 | HR 85 | Ht 65.0 in | Wt 164.4 lb

## 2018-01-11 DIAGNOSIS — N840 Polyp of corpus uteri: Secondary | ICD-10-CM

## 2018-01-11 DIAGNOSIS — N939 Abnormal uterine and vaginal bleeding, unspecified: Secondary | ICD-10-CM

## 2018-01-11 DIAGNOSIS — R9389 Abnormal findings on diagnostic imaging of other specified body structures: Secondary | ICD-10-CM | POA: Diagnosis not present

## 2018-01-11 NOTE — Progress Notes (Signed)
Patient ID: Dana Escobar, female   DOB: 11-07-1980, 37 y.o.   MRN: [id number]  Chief Complaint  Patient presents with  . Follow-up    HPI Dana Escobar is a 37 y.o. female.  History of AUB.  Ultrasound revealed a thickened endometrium.  Endometrial biopsy done and was significant for fragment of endometrial polyp, and normal non-polypoid secretory endometrium.  Patient presents today for results and management recommendations.  HPI  Past Medical History:  Diagnosis Date  . Depression 02/28/2012  . Fibromyalgia     History reviewed. No pertinent surgical history.  Family History  Problem Relation Age of Onset  . Breast cancer Mother 71  . Breast cancer Unknown   . Breast cancer Maternal Grandmother   . Hypertension Maternal Grandfather   . Diabetes Paternal Grandmother     Social History Social History   Tobacco Use  . Smoking status: Never Smoker  . Smokeless tobacco: Never Used  Substance Use Topics  . Alcohol use: Yes    Alcohol/week: 0.0 oz    Comment: ocassionally  . Drug use: No    No Known Allergies  Current Outpatient Medications  Medication Sig Dispense Refill  . acetaminophen (TYLENOL) 500 MG tablet Take 1,000 mg by mouth every 6 (six) hours as needed for moderate pain.    Marland Kitchen ALPRAZolam (XANAX) 0.5 MG tablet Take 1 tablet (0.5 mg total) by mouth at bedtime as needed for anxiety. 30 tablet 2  . Clindamycin Phosphate, 1 Dose, (CLINDESSE) vaginal cream One applicator full  intravaginally once at bedtime. 5.8 g 5  . ibuprofen (ADVIL,MOTRIN) 800 MG tablet Take 1 tablet (800 mg total) by mouth every 8 (eight) hours as needed. 30 tablet 5  . Lysine 500 MG CAPS Take 1 capsule by mouth daily.    . Multiple Vitamin (MULTIVITAMIN) capsule Take 1 capsule by mouth daily.    . nitrofurantoin, macrocrystal-monohydrate, (MACROBID) 100 MG capsule TAKE 1 CAPSULE BY MOUTH EVERY 12 HOURS WITH FOOD FOR 5 DAYS  0   No current facility-administered medications for this visit.      Review of Systems Review of Systems Constitutional: negative for fatigue and weight loss Respiratory: negative for cough and wheezing Cardiovascular: negative for chest pain, fatigue and palpitations Gastrointestinal: negative for abdominal pain and change in bowel habits Genitourinary:POSITIVE for AUB Integument/breast: negative for nipple discharge Musculoskeletal:negative for myalgias Neurological: negative for gait problems and tremors Behavioral/Psych: negative for abusive relationship, depression Endocrine: negative for temperature intolerance      Blood pressure (!) 149/84, pulse 85, height  (1.651 m), weight 164 lb 6.4 oz (74.6 kg), last menstrual period 12/26/2017.  Physical Exam Physical Exam:  Deferred  50% of 15 min visit spent on counseling and coordination of care.   Data Reviewed Ultrasound Pathology  Assessment     1. Abnormal uterine bleeding (AUB)  2. Thickened endometrium  3. Endometrial polyp     Plan    Follow up in 1-2 weeks for surgical consultation with Dr. Earlene Plater for Hysteroscopy / D&C with MyoSure   No orders of the defined types were placed in this encounter.  No orders of the defined types were placed in this encounter.   Brock Bad MD 01-11-2018

## 2018-01-11 NOTE — Patient Instructions (Signed)
Hysteroscopy Hysteroscopy is a procedure used for looking inside the womb (uterus). It may be done for various reasons, including:  To evaluate abnormal bleeding, fibroid (benign, noncancerous) tumors, polyps, scar tissue (adhesions), and possibly cancer of the uterus.  To look for lumps (tumors) and other uterine growths.  To look for causes of why a woman cannot get pregnant (infertility), causes of recurrent loss of pregnancy (miscarriages), or a lost intrauterine device (IUD).  To perform a sterilization by blocking the fallopian tubes from inside the uterus.  In this procedure, a thin, flexible tube with a tiny light and camera on the end of it (hysteroscope) is used to look inside the uterus. A hysteroscopy should be done right after a menstrual period to be sure you are not pregnant. LET [facility] CARE PROVIDER KNOW ABOUT:  Any allergies you have.  All medicines you are taking, including vitamins, herbs, eye drops, creams, and over-the-counter medicines.  Previous problems you or members of your family have had with the use of anesthetics.  Any blood disorders you have.  Previous surgeries you have had.  Medical conditions you have. RISKS AND COMPLICATIONS Generally, this is a safe procedure. However, as with any procedure, complications can occur. Possible complications include:  Putting a hole in the uterus.  Excessive bleeding.  Infection.  Damage to the cervix.  Injury to other organs.  Allergic reaction to medicines.  Too much fluid used in the uterus for the procedure.  BEFORE THE PROCEDURE  Ask your health care provider about changing or stopping any regular medicines.  Do not take aspirin or blood thinners for 1 week before the procedure, or as directed by your health care provider. These can cause bleeding.  If you smoke, do not smoke for 2 weeks before the procedure.  In some cases, a medicine is placed in the cervix the day before the procedure.  This medicine makes the cervix have a larger opening (dilate). This makes it easier for the instrument to be inserted into the uterus during the procedure.  Do not eat or drink anything for at least 8 hours before the surgery.  Arrange for someone to take you home after the procedure. PROCEDURE  You may be given a medicine to relax you (sedative). You may also be given one of the following: ? A medicine that numbs the area around the cervix (local anesthetic). ? A medicine that makes you sleep through the procedure (general anesthetic).  The hysteroscope is inserted through the vagina into the uterus. The camera on the hysteroscope sends a picture to a TV screen. This gives the surgeon a good view inside the uterus.  During the procedure, air or a liquid is put into the uterus, which allows the surgeon to see better.  Sometimes, tissue is gently scraped from inside the uterus. These tissue samples are sent to a lab for testing. What to expect after the procedure  If you had a general anesthetic, you may be groggy for a couple hours after the procedure.  If you had a local anesthetic, you will be able to go home as soon as you are stable and feel ready.  You may have some cramping. This normally lasts for a couple days.  You may have bleeding, which varies from light spotting for a few days to menstrual-like bleeding for 3-7 days. This is normal.  If your test results are not back during the visit, make an appointment with your health care provider to find out the  results. This information is not intended to replace advice given to you by your health care provider. Make sure you discuss any questions you have with your health care provider. Document Released: 11/08/2000 Document Revised: 01/08/2016 Document Reviewed: 03/01/2013 Elsevier Interactive Patient Education  2017 [employer]vier Inc.  Dilation and Curettage or Vacuum Curettage Dilation and curettage (D&C) and vacuum curettage are  minor procedures. A D&C involves stretching (dilation) the cervix and scraping (curettage) the inside lining of the uterus (endometrium). During a D&C, tissue is gently scraped from the endometrium, starting from the top portion of the uterus down to the lowest part of the uterus (cervix). During a vacuum curettage, the lining and tissue in the uterus are removed with the use of gentle suction. Curettage may be performed to either diagnose or treat a problem. As a diagnostic procedure, curettage is performed to examine tissues from the uterus. A diagnostic curettage may be done if you have:  Irregular bleeding in the uterus.  Bleeding with the development of clots.  Spotting between menstrual periods.  Prolonged menstrual periods or other abnormal bleeding.  Bleeding after menopause.  No menstrual period (amenorrhea).  A change in size and shape of the uterus.  Abnormal endometrial cells discovered during a Pap test.  As a treatment procedure, curettage may be performed for the following reasons:  Removal of an IUD (intrauterine device).  Removal of retained placenta after giving birth.  Abortion.  Miscarriage.  Removal of endometrial polyps.  Removal of uncommon types of noncancerous lumps (fibroids).  Tell a health care provider about:  Any allergies you have, including allergies to prescribed medicine or latex.  All medicines you are taking, including vitamins, herbs, eye drops, creams, and over-the-counter medicines. This is especially important if you take any blood-thinning medicine. Bring a list of all of your medicines to your appointment.  Any problems you or family members have had with anesthetic medicines.  Any blood disorders you have.  Any surgeries you have had.  Your medical history and any medical conditions you have.  Whether you are pregnant or may be pregnant.  Recent vaginal infections you have had.  Recent menstrual periods, bleeding problems  you have had, and what form of birth control (contraception) you use. What are the risks? Generally, this is a safe procedure. However, problems may occur, including:  Infection.  Heavy vaginal bleeding.  Allergic reactions to medicines.  Damage to the cervix or other structures or organs.  Development of scar tissue (adhesions) inside the uterus, which can cause abnormal amounts of menstrual bleeding. This may make it harder to get pregnant in the future.  A hole (perforation) or puncture in the uterine wall. This is rare.  What happens before the procedure? Staying hydrated Follow instructions from your health care provider about hydration, which may include:  Up to 2 hours before the procedure - you may continue to drink clear liquids, such as water, clear fruit juice, black coffee, and plain tea.  Eating and drinking restrictions Follow instructions from your health care provider about eating and drinking, which may include:  8 hours before the procedure - stop eating heavy meals or foods such as meat, fried foods, or fatty foods.  6 hours before the procedure - stop eating light meals or foods, such as toast or cereal.  6 hours before the procedure - stop drinking milk or drinks that contain milk.  2 hours before the procedure - stop drinking clear liquids. If your health care provider told you  to take your medicine(s) on the day of your procedure, take them with only a sip of water.  Medicines  Ask your health care provider about: ? Changing or stopping your regular medicines. This is especially important if you are taking diabetes medicines or blood thinners. ? Taking medicines such as aspirin and ibuprofen. These medicines can thin your blood. Do not take these medicines before your procedure if your health care provider instructs you not to.  You may be given antibiotic medicine to help prevent infection. General instructions  For 24 hours before your procedure, do  not: ? Douche. ? Use tampons. ? Use medicines, creams, or suppositories in the vagina. ? Have sexual intercourse.  You may be given a pregnancy test on the day of the procedure.  Plan to have someone take you home from the hospital or clinic.  You may have a blood or urine sample taken.  If you will be going home right after the procedure, plan to have someone with you for 24 hours. What happens during the procedure?  To reduce your risk of infection: ? Your health care team will wash or sanitize their hands. ? Your skin will be washed with soap.  An IV tube will be inserted into one of your veins.  You will be given one of the following: ? A medicine that numbs the area in and around the cervix (local anesthetic). ? A medicine to make you fall asleep (general anesthetic).  You will lie down on your back, with your feet in foot rests (stirrups).  The size and position of your uterus will be checked.  A lubricated instrument (speculum or Sims retractor) will be inserted into the back side of your vagina. The speculum will be used to hold apart the walls of your vagina so your health care provider can see your cervix.  A tool (tenaculum) will be attached to the lip of the cervix to stabilize it.  Your cervix will be softened and dilated. This may be done by: ? Taking a medicine. ? Having tapered dilators or thin rods (laminaria) or gradual widening instruments (tapered dilators) inserted into your cervix.  A small, sharp, curved instrument (curette) will be used to scrape a small amount of tissue or cells from the endometrium or cervical canal. In some cases, gentle suction is applied with the curette. The curette will then be removed. The cells will be taken to a lab for testing. The procedure may vary among health care providers and hospitals. What happens after the procedure?  You may have mild cramping, backache, pain, and light bleeding or spotting. You may pass small  blood clots from your vagina.  You may have to wear compression stockings. These stockings help to prevent blood clots and reduce swelling in your legs.  Your blood pressure, heart rate, breathing rate, and blood oxygen level will be monitored until the medicines you were given have worn off. Summary  Dilation and curettage (D&C) involves stretching (dilation) the cervix and scraping (curettage) the inside lining of the uterus (endometrium).  After the procedure, you may have mild cramping, backache, pain, and light bleeding or spotting. You may pass small blood clots from your vagina.  Plan to have someone take you home from the hospital or clinic. This information is not intended to replace advice given to you by your health care provider. Make sure you discuss any questions you have with your health care provider. Document Released: 08/02/2005 Document Revised: 04/18/2016 Document Reviewed: 04/18/2016  Elsevier Interactive Patient Education  [employer].  Dilation and Curettage or Vacuum Curettage, Care After This sheet gives you information about how to care for yourself after your procedure. Your health care provider may also give you more specific instructions. If you have problems or questions, contact your health care provider. What can I expect after the procedure? After your procedure, it is common to have:  Mild pain or cramping.  Some vaginal bleeding or spotting.  These may last for up to 2 weeks after your procedure. Follow these instructions at home: Activity   Do not drive or use heavy machinery while taking prescription pain medicine.  Avoid driving for the first 24 hours after your procedure.  Take frequent, short walks, followed by rest periods, throughout the day. Ask your health care provider what activities are safe for you. After 1-2 days, you may be able to return to your normal activities.  Do not lift anything heavier than 10 lb (4.5 kg) until your  health care provider approves.  For at least 2 weeks, or as long as told by your health care provider, do not: ? Douche. ? Use tampons. ? Have sexual intercourse. General instructions   Take over-the-counter and prescription medicines only as told by your health care provider. This is especially important if you take blood thinning medicine.  Do not take baths, swim, or use a hot tub until your health care provider approves. Take showers instead of baths.  Wear compression stockings as told by your health care provider. These stockings help to prevent blood clots and reduce swelling in your legs.  It is your responsibility to get the results of your procedure. Ask your health care provider, or the department performing the procedure, when your results will be ready.  Keep all follow-up visits as told by your health care provider. This is important. Contact a health care provider if:  You have severe cramps that get worse or that do not get better with medicine.  You have severe abdominal pain.  You cannot drink fluids without vomiting.  You develop pain in a different area of your pelvis.  You have bad-smelling vaginal discharge.  You have a rash. Get help right away if:  You have vaginal bleeding that soaks more than one sanitary pad in 1 hour, for 2 hours in a row.  You pass large blood clots from your vagina.  You have a fever that is above 100.43F (38.0C).  Your abdomen feels very tender or hard.  You have chest pain.  You have shortness of breath.  You cough up blood.  You feel dizzy or light-headed.  You faint.  You have pain in your neck or shoulder area. This information is not intended to replace advice given to you by your health care provider. Make sure you discuss any questions you have with your health care provider. Document Released: 07/30/2000 Document Revised: 03/31/2016 Document Reviewed: 03/04/2016 Elsevier Interactive Patient Education  [employer].

## 2018-01-17 ENCOUNTER — Telehealth: Payer: Self-pay | Admitting: Obstetrics and Gynecology

## 2018-01-17 ENCOUNTER — Encounter (HOSPITAL_COMMUNITY): Payer: Self-pay

## 2018-01-17 ENCOUNTER — Ambulatory Visit (INDEPENDENT_AMBULATORY_CARE_PROVIDER_SITE_OTHER): Payer: 59 | Admitting: Obstetrics and Gynecology

## 2018-01-17 ENCOUNTER — Encounter: Payer: Self-pay | Admitting: Obstetrics and Gynecology

## 2018-01-17 VITALS — BP 145/92 | HR 106 | Ht 65.0 in | Wt 166.6 lb

## 2018-01-17 DIAGNOSIS — N939 Abnormal uterine and vaginal bleeding, unspecified: Secondary | ICD-10-CM

## 2018-01-17 DIAGNOSIS — N840 Polyp of corpus uteri: Secondary | ICD-10-CM

## 2018-01-17 NOTE — Telephone Encounter (Signed)
Pt has some additional questions about surgery. Please call pt today.

## 2018-01-17 NOTE — Progress Notes (Signed)
GYNECOLOGY OFFICE FOLLOW UP NOTE  History:  37 y.o. G2P1011 here today for follow up for irregular spotting. Menses last 5 days, are monthly, heavier the first couple of days and then lighten. The first few days have been heavier the last few years. Would have spotting 2-3 days after her period for several days for the last few cycles, which has never happened before.     Not planning on having future children, nothing for contraception currently, not currently sexually active.   Past Medical History:  Diagnosis Date  . Depression 02/28/2012  . Fibromyalgia    History reviewed. No pertinent surgical history.   Current Outpatient Medications:  .  acetaminophen (TYLENOL) 500 MG tablet, Take 1,000 mg by mouth every 6 (six) hours as needed for moderate pain., Disp: , Rfl:  .  ALPRAZolam (XANAX) 0.5 MG tablet, Take 1 tablet (0.5 mg total) by mouth at bedtime as needed for anxiety., Disp: 30 tablet, Rfl: 2 .  Clindamycin Phosphate, 1 Dose, (CLINDESSE) vaginal cream, One applicator full  intravaginally once at bedtime., Disp: 5.8 g, Rfl: 5 .  ibuprofen (ADVIL,MOTRIN) 800 MG tablet, Take 1 tablet (800 mg total) by mouth every 8 (eight) hours as needed., Disp: 30 tablet, Rfl: 5 .  Lysine 500 MG CAPS, Take 1 capsule by mouth daily., Disp: , Rfl:  .  meloxicam (MOBIC) 7.5 MG tablet, Take 7.5 mg by mouth daily., Disp: , Rfl: 0 .  Multiple Vitamin (MULTIVITAMIN) capsule, Take 1 capsule by mouth daily., Disp: , Rfl:  .  nitrofurantoin, macrocrystal-monohydrate, (MACROBID) 100 MG capsule, TAKE 1 CAPSULE BY MOUTH EVERY 12 HOURS WITH FOOD FOR 5 DAYS, Disp: , Rfl: 0  The following portions of the patient's history were reviewed and updated as appropriate: allergies, current medications, past family history, past medical history, past social history, past surgical history and problem list.   Review of Systems:  Pertinent items noted in HPI and remainder of comprehensive ROS otherwise negative.     Objective:  Physical Exam BP (!) 145/92   Pulse (!) 106   Ht 5\' 5"  (1.651 m)   Wt 166 lb 9.6 oz (75.6 kg)   LMP 12/26/2017   BMI 27.72 kg/m  CONSTITUTIONAL: Well-developed, well-nourished female in no acute distress.  HENT:  Normocephalic, atraumatic. External right and left ear normal. Oropharynx is clear and moist EYES: Conjunctivae and EOM are normal. Pupils are equal, round, and reactive to light. No scleral icterus.  NECK: Normal range of motion, supple, no masses SKIN: Skin is warm and dry. No rash noted. Not diaphoretic. No erythema. No pallor. NEUROLOGIC: Alert and oriented to person, place, and time. Normal reflexes, muscle tone coordination. No cranial nerve deficit noted. PSYCHIATRIC: Normal mood and affect. Normal behavior. Normal judgment and thought content. CARDIOVASCULAR: Normal heart rate noted RESPIRATORY: Effort and breath sounds normal, no problems with respiration noted ABDOMEN: Soft, no distention noted.   PELVIC: Deferred MUSCULOSKELETAL: Normal range of motion. No edema noted.  Labs and Imaging US Pelvic Complete With Transvaginal  Result Date: 12/20/2017 CLINICAL DATA:  Patient with dysfunctional uterine bleeding. EXAM: TRANSABDOMINAL AND TRANSVAGINAL ULTRASOUND OF PELVIS TECHNIQUE: Both transabdominal and transvaginal ultrasound examinations of the pelvis were performed. Transabdominal technique was performed for global imaging of the pelvis including uterus, ovaries, adnexal regions, and pelvic cul-de-sac. It was necessary to proceed with endovaginal exam following the transabdominal exam to visualize the endometrium. COMPARISON:  None FINDINGS: Uterus Measurements: 9.3 x 4.0 x 4.7 cm. No fibroids or other mass  visualized. Endometrium Thickness: 16 mm.  No focal abnormality visualized. Right ovary Measurements: 3.0 x 3.1 x 1.9 cm. Normal appearance/no adnexal mass. Left ovary Measurements: 4.9 x 2.6 x 3.7 cm. Prominent follicles within the left ovary. Other  findings Trace fluid in the pelvis IMPRESSION: Endometrium measures 16 mm. If bleeding remains unresponsive to hormonal or medical therapy, focal lesion work-up with sonohysterogram should be considered. Endometrial biopsy should also be considered in pre-menopausal patients at high risk for endometrial carcinoma. (Ref: Radiological Reasoning: Algorithmic Workup of Abnormal Vaginal Bleeding with Endovaginal Sonography and Sonohysterography. AJR 2008; 409:W11-91; 191:S68-73) Electronically Signed   By: Annia Beltrew  Ausar Georgiou M.D.   On: 12/20/2017 12:56   Diagnosis Endometrium, biopsy - FRAGMENTS OF BENIGN ENDOMETRIAL POLYP. - FRAGMENTS OF BENIGN NON-POLYPOID ENDOMETRIUM IN SECRETORY PHASE. - NEGATIVE FOR HYPERPLASIA OR MALIGNANCY. Holley BoucheNilesh Kashikar MD Pathologist, Electronic Signature (Case signed 12/27/2017)  Assessment & Plan:   1. Abnormal uterine bleeding (AUB) Neg EMB, see below  2. Endometrial polyp Reviewed likelihood that polyp is causing intermenstrual bleeding, may be source of heavier menses. Reviewed that heavier menses may be anovulatory and due to increasing age. Reviewed management of polyp including expectant management vs surgical removal via D&C hysteroscopy. Reviewed that polyp may have been entirely removed with biopsy or there may be more than one polyp but cannot know unless lining of uterus is visualized. Reviewed hysteroscopy D&C, polypectomy. She would like to proceed with removal of polyp via hysteroscopy D&C, polypectomy.  The risks of hysteroscopic polypectomy with D&C were reviewed with the patient; including but not limited to: infection which may require antibiotics; bleeding which may require transfusion or re-operation; injury surrounding organs; need for additional procedures, placental abnormalities wth subsequent pregnancies, thromboembolic phenomenon and other postoperative/anesthesia complications. Reviewed that all sampling will be sent to pathology and further management based on  findings. The patient concurred with the proposed plan, giving informed consent for the procedure. She is agreeable to a blood transfusion in the event of emergency.  Reviewed expected course and recovery, she verbalizes understanding.   Routine preventative health maintenance measures emphasized. Please refer to After Visit Summary for other counseling recommendations.   Return in about 1 month (around 02/14/2018).   Baldemar LenisK. Meryl Foy Mungia, M.D. Attending Obstetrician & Gynecologist, [facility], [facility]

## 2018-01-19 NOTE — Telephone Encounter (Signed)
Returned patient call, she had some further questions about D&C, answered all questions. She will call with any further issues.  Baldemar LenisK. Meryl Davis, M.D. Attending Obstetrician & Gynecologist, [facility], [facility]

## 2018-01-24 ENCOUNTER — Encounter: Payer: Self-pay | Admitting: *Deleted

## 2018-03-15 ENCOUNTER — Encounter (HOSPITAL_BASED_OUTPATIENT_CLINIC_OR_DEPARTMENT_OTHER): Payer: Self-pay | Admitting: *Deleted

## 2018-03-15 ENCOUNTER — Other Ambulatory Visit: Payer: Self-pay

## 2018-03-20 ENCOUNTER — Encounter (HOSPITAL_BASED_OUTPATIENT_CLINIC_OR_DEPARTMENT_OTHER)
Admission: RE | Admit: 2018-03-20 | Discharge: 2018-03-20 | Disposition: A | Discharge status: Home / Self Care | Payer: 59 | Source: Ambulatory Visit | Attending: Obstetrics and Gynecology | Admitting: Obstetrics and Gynecology

## 2018-03-20 DIAGNOSIS — F419 Anxiety disorder, unspecified: Secondary | ICD-10-CM | POA: Diagnosis not present

## 2018-03-20 DIAGNOSIS — N840 Polyp of corpus uteri: Secondary | ICD-10-CM | POA: Diagnosis not present

## 2018-03-20 DIAGNOSIS — N939 Abnormal uterine and vaginal bleeding, unspecified: Secondary | ICD-10-CM | POA: Diagnosis present

## 2018-03-20 DIAGNOSIS — F329 Major depressive disorder, single episode, unspecified: Secondary | ICD-10-CM | POA: Diagnosis not present

## 2018-03-20 DIAGNOSIS — Z79899 Other long term (current) drug therapy: Secondary | ICD-10-CM | POA: Diagnosis not present

## 2018-03-20 DIAGNOSIS — M797 Fibromyalgia: Secondary | ICD-10-CM | POA: Diagnosis not present

## 2018-03-20 LAB — CBC
HCT: 32.8 % — ABNORMAL LOW (ref 36.0–46.0)
Hemoglobin: 10.6 g/dL — ABNORMAL LOW (ref 12.0–15.0)
MCH: 29.5 pg (ref 26.0–34.0)
MCHC: 32.3 g/dL (ref 30.0–36.0)
MCV: 91.4 fL (ref 78.0–100.0)
Platelets: 329 10*3/uL (ref 150–400)
RBC: 3.59 MIL/uL — ABNORMAL LOW (ref 3.87–5.11)
RDW: 13.9 % (ref 11.5–15.5)
WBC: 4.8 10*3/uL (ref 4.0–10.5)

## 2018-03-20 LAB — BASIC METABOLIC PANEL
Anion gap: 10 (ref 5–15)
BUN: 10 mg/dL (ref 6–20)
CO2: 25 mmol/L (ref 22–32)
Calcium: 9.2 mg/dL (ref 8.9–10.3)
Chloride: 102 mmol/L (ref 98–111)
Creatinine, Ser: 0.87 mg/dL (ref 0.44–1.00)
GFR calc Af Amer: >60 mL/min (ref >60)
Glucose, Bld: 94 mg/dL (ref 70–99)
POTASSIUM: 4.4 mmol/L (ref 3.5–5.1)
SODIUM: 137 mmol/L (ref 135–145)

## 2018-03-20 LAB — POCT PREGNANCY, URINE: PREG TEST UR: NEGATIVE

## 2018-03-20 LAB — HCG, QUANTITATIVE, PREGNANCY

## 2018-03-22 ENCOUNTER — Ambulatory Visit (HOSPITAL_BASED_OUTPATIENT_CLINIC_OR_DEPARTMENT_OTHER): Payer: 59 | Admitting: Anesthesiology

## 2018-03-22 ENCOUNTER — Other Ambulatory Visit: Payer: Self-pay

## 2018-03-22 ENCOUNTER — Ambulatory Visit (HOSPITAL_BASED_OUTPATIENT_CLINIC_OR_DEPARTMENT_OTHER)
Admission: RE | Admit: 2018-03-22 | Discharge: 2018-03-22 | Disposition: A | Discharge status: Home / Self Care | Payer: 59 | Source: Ambulatory Visit | Attending: Obstetrics and Gynecology | Admitting: Obstetrics and Gynecology

## 2018-03-22 ENCOUNTER — Encounter (HOSPITAL_BASED_OUTPATIENT_CLINIC_OR_DEPARTMENT_OTHER)
Admission: RE | Disposition: A | Discharge status: Home / Self Care | Payer: Self-pay | Source: Ambulatory Visit | Attending: Obstetrics and Gynecology

## 2018-03-22 ENCOUNTER — Encounter (HOSPITAL_BASED_OUTPATIENT_CLINIC_OR_DEPARTMENT_OTHER): Payer: Self-pay | Admitting: *Deleted

## 2018-03-22 DIAGNOSIS — Z79899 Other long term (current) drug therapy: Secondary | ICD-10-CM | POA: Insufficient documentation

## 2018-03-22 DIAGNOSIS — N840 Polyp of corpus uteri: Secondary | ICD-10-CM

## 2018-03-22 DIAGNOSIS — F419 Anxiety disorder, unspecified: Secondary | ICD-10-CM | POA: Insufficient documentation

## 2018-03-22 DIAGNOSIS — N939 Abnormal uterine and vaginal bleeding, unspecified: Secondary | ICD-10-CM | POA: Insufficient documentation

## 2018-03-22 DIAGNOSIS — F329 Major depressive disorder, single episode, unspecified: Secondary | ICD-10-CM | POA: Insufficient documentation

## 2018-03-22 DIAGNOSIS — M797 Fibromyalgia: Secondary | ICD-10-CM | POA: Insufficient documentation

## 2018-03-22 HISTORY — PX: DILATATION & CURETTAGE/HYSTEROSCOPY WITH MYOSURE: SHX6511

## 2018-03-22 HISTORY — DX: Anxiety disorder, unspecified: F41.9

## 2018-03-22 LAB — POCT PREGNANCY, URINE: Preg Test, Ur: NEGATIVE

## 2018-03-22 SURGERY — DILATATION & CURETTAGE/HYSTEROSCOPY WITH MYOSURE
Anesthesia: General | Site: Uterus

## 2018-03-22 MED ORDER — HYDROMORPHONE HCL 1 MG/ML IJ SOLN
0.2500 mg | INTRAMUSCULAR | Status: DC | PRN
  Administered 2018-03-22: 0.5 mg via INTRAVENOUS

## 2018-03-22 MED ORDER — SCOPOLAMINE 1 MG/3DAYS TD PT72: 1.0000 | MEDICATED_PATCH | Freq: Once | TRANSDERMAL | Status: DC | PRN

## 2018-03-22 MED ORDER — LIDOCAINE 2% (20 MG/ML) 5 ML SYRINGE
INTRAMUSCULAR | Status: AC
  Filled 2018-03-22: qty 5

## 2018-03-22 MED ORDER — PROMETHAZINE HCL 25 MG/ML IJ SOLN: 6.2500 mg | INTRAMUSCULAR | Status: DC | PRN

## 2018-03-22 MED ORDER — SODIUM CHLORIDE 0.9 % IR SOLN
Status: DC | PRN
  Administered 2018-03-22: 3000 mL

## 2018-03-22 MED ORDER — FENTANYL CITRATE (PF) 100 MCG/2ML IJ SOLN
INTRAMUSCULAR | Status: AC
  Filled 2018-03-22: qty 2

## 2018-03-22 MED ORDER — MEPERIDINE HCL 25 MG/ML IJ SOLN: 6.2500 mg | INTRAMUSCULAR | Status: DC | PRN

## 2018-03-22 MED ORDER — IBUPROFEN 600 MG PO TABS: 600.0000 mg | ORAL_TABLET | Freq: Four times a day (QID) | ORAL | 1 refills | Status: DC | PRN

## 2018-03-22 MED ORDER — PROPOFOL 10 MG/ML IV BOLUS
INTRAVENOUS | Status: DC | PRN
  Administered 2018-03-22: 200 mg via INTRAVENOUS

## 2018-03-22 MED ORDER — DEXAMETHASONE SODIUM PHOSPHATE 10 MG/ML IJ SOLN
INTRAMUSCULAR | Status: AC
Start: 2018-03-22 — End: ?
  Filled 2018-03-22: qty 1

## 2018-03-22 MED ORDER — OXYCODONE HCL 5 MG PO TABS: 5.0000 mg | ORAL_TABLET | Freq: Once | ORAL | Status: DC | PRN

## 2018-03-22 MED ORDER — MIDAZOLAM HCL 2 MG/2ML IJ SOLN
1.0000 mg | INTRAMUSCULAR | Status: DC | PRN
  Administered 2018-03-22: 2 mg via INTRAVENOUS

## 2018-03-22 MED ORDER — FENTANYL CITRATE (PF) 100 MCG/2ML IJ SOLN
50.0000 ug | INTRAMUSCULAR | Status: DC | PRN
  Administered 2018-03-22: 100 ug via INTRAVENOUS

## 2018-03-22 MED ORDER — LACTATED RINGERS IV SOLN: INTRAVENOUS | Status: DC

## 2018-03-22 MED ORDER — OXYCODONE HCL 5 MG/5ML PO SOLN: 5.0000 mg | Freq: Once | ORAL | Status: DC | PRN

## 2018-03-22 MED ORDER — EPHEDRINE SULFATE-NACL 50-0.9 MG/10ML-% IV SOSY
PREFILLED_SYRINGE | INTRAVENOUS | Status: DC | PRN
  Administered 2018-03-22 (×3): 10 mg via INTRAVENOUS

## 2018-03-22 MED ORDER — ONDANSETRON HCL 4 MG/2ML IJ SOLN
INTRAMUSCULAR | Status: DC | PRN
  Administered 2018-03-22: 4 mg via INTRAVENOUS

## 2018-03-22 MED ORDER — OXYCODONE-ACETAMINOPHEN 5-325 MG PO TABS: 1.0000 | ORAL_TABLET | Freq: Four times a day (QID) | ORAL | 0 refills | Status: DC | PRN

## 2018-03-22 MED ORDER — LACTATED RINGERS IV SOLN
INTRAVENOUS | Status: DC
  Administered 2018-03-22 (×2): via INTRAVENOUS

## 2018-03-22 MED ORDER — LIDOCAINE 2% (20 MG/ML) 5 ML SYRINGE
INTRAMUSCULAR | Status: DC | PRN
  Administered 2018-03-22: 80 mg via INTRAVENOUS

## 2018-03-22 MED ORDER — MIDAZOLAM HCL 2 MG/2ML IJ SOLN
INTRAMUSCULAR | Status: AC
  Filled 2018-03-22: qty 2

## 2018-03-22 MED ORDER — DEXAMETHASONE SODIUM PHOSPHATE 4 MG/ML IJ SOLN
INTRAMUSCULAR | Status: DC | PRN
  Administered 2018-03-22: 10 mg via INTRAVENOUS

## 2018-03-22 MED ORDER — HYDROMORPHONE HCL 1 MG/ML IJ SOLN
INTRAMUSCULAR | Status: AC
  Filled 2018-03-22: qty 0.5

## 2018-03-22 SURGICAL SUPPLY — 21 items
BRIEF STRETCH FOR OB PAD XXL (UNDERPADS AND DIAPERS) ×3 IMPLANT
CANISTER AQUILEX FLUID KIT (CANNISTER) ×3 IMPLANT
CATH ROBINSON RED A/P 16FR (CATHETERS) ×3 IMPLANT
DEVICE MYOSURE LITE (MISCELLANEOUS) ×3 IMPLANT
DEVICE MYOSURE REACH (MISCELLANEOUS) IMPLANT
GLOVE BIO SURGEON STRL SZ 6.5 (GLOVE) ×2 IMPLANT
GLOVE BIO SURGEONS STRL SZ 6.5 (GLOVE) ×1
GLOVE BIOGEL PI IND STRL 6.5 (GLOVE) ×1 IMPLANT
GLOVE BIOGEL PI IND STRL 7.0 (GLOVE) ×1 IMPLANT
GLOVE BIOGEL PI INDICATOR 6.5 (GLOVE) ×2
GLOVE BIOGEL PI INDICATOR 7.0 (GLOVE) ×2
GLOVE SURG SS PI 7.0 STRL IVOR (GLOVE) ×3 IMPLANT
GOWN STRL REUS W/TWL LRG LVL3 (GOWN DISPOSABLE) ×6 IMPLANT
PACK VAGINAL MINOR WOMEN LF (CUSTOM PROCEDURE TRAY) ×3 IMPLANT
PAD OB MATERNITY 4.3X12.25 (PERSONAL CARE ITEMS) ×3 IMPLANT
PAD PREP 24X48 CUFFED NSTRL (MISCELLANEOUS) ×3 IMPLANT
SEAL ROD LENS SCOPE MYOSURE (ABLATOR) ×3 IMPLANT
SLEEVE SCD COMPRESS KNEE MED (MISCELLANEOUS) ×3 IMPLANT
TOWEL GREEN STERILE FF (TOWEL DISPOSABLE) ×6 IMPLANT
TUBING AQUILEX INFLOW (TUBING) ×3 IMPLANT
TUBING AQUILEX OUTFLOW (TUBING) ×3 IMPLANT

## 2018-03-22 NOTE — Discharge Instructions (Signed)
Hysteroscopy, Care After °Refer to this sheet in the next few weeks. These instructions provide you with information on caring for yourself after your procedure. Your health care provider may also give you more specific instructions. Your treatment has been planned according to current medical practices, but problems sometimes occur. Call your health care provider if you have any problems or questions after your procedure. °What can I expect after the procedure? °After your procedure, it is typical to have the following: °· You may have some cramping. This normally lasts for a couple days. °· You may have bleeding. This can vary from light spotting for a few days to menstrual-like bleeding for 3-7 days. ° °Follow these instructions at home: °· Rest for the first 1-2 days after the procedure. °· Only take over-the-counter or prescription medicines as directed by your health care provider. Do not take aspirin. It can increase the chances of bleeding. °· Take showers instead of baths for 2 weeks or as directed by your health care provider. °· Do not drive for 24 hours or as directed. °· Do not drink alcohol while taking pain medicine. °· Do not use tampons, douche, or have sexual intercourse for 2 weeks or until your health care provider says it is okay. °· Take your temperature twice a day for 4-5 days. Write it down each time. °· Follow your health care provider's advice about diet, exercise, and lifting. °· If you develop constipation, you may: °? Take a mild laxative if your health care provider approves. °? Add bran foods to your diet. °? Drink enough fluids to keep your urine clear or pale yellow. °· Try to have someone with you or available to you for the first 24-48 hours, especially if you were given a general anesthetic. °· Follow up with your health care provider as directed. °Contact a health care provider if: °· You feel dizzy or lightheaded. °· You feel sick to your stomach (nauseous). °· You have  abnormal vaginal discharge. °· You have a rash. °· You have pain that is not controlled with medicine. °Get help right away if: °· You have bleeding that is heavier than a normal menstrual period. °· You have a fever. °· You have increasing cramps or pain, not controlled with medicine. °· You have new belly (abdominal) pain. °· You pass out. °· You have pain in the tops of your shoulders (shoulder strap areas). °· You have shortness of breath. °This information is not intended to replace advice given to you by your health care provider. Make sure you discuss any questions you have with your health care provider. °Document Released: 05/23/2013 Document Revised: 01/08/2016 Document Reviewed: 03/01/2013 °Elsevier Interactive Patient Education © 2017 [employer] ° ° °Post Anesthesia Home Care Instructions ° °Activity: °Get plenty of rest for the remainder of the day. A responsible individual must stay with you for 24 hours following the procedure.  °For the next 24 hours, DO NOT: °-Drive a car °-Operate machinery °-Drink alcoholic beverages °-Take any medication unless instructed by your physician °-Make any legal decisions or sign important papers. ° °Meals: °Start with liquid foods such as gelatin or soup. Progress to regular foods as tolerated. Avoid greasy, spicy, heavy foods. If nausea and/or vomiting occur, drink only clear liquids until the nausea and/or vomiting subsides. Call your physician if vomiting continues. ° °Special Instructions/Symptoms: °Your throat may feel dry or sore from the anesthesia or the breathing tube placed in your throat during surgery. If this causes discomfort, gargle   with warm salt water. The discomfort should disappear within 24 hours. ° °If you had a scopolamine patch placed behind your ear for the management of post- operative nausea and/or vomiting: ° °1. The medication in the patch is effective for 72 hours, after which it should be removed.  Wrap patch in a tissue and discard in  the trash. Wash hands thoroughly with soap and water. °2. You may remove the patch earlier than 72 hours if you experience unpleasant side effects which may include dry mouth, dizziness or visual disturbances. °3. Avoid touching the patch. Wash your hands with soap and water after contact with the patch. °  ° ° °

## 2018-03-22 NOTE — H&P (Signed)
OB/GYN History and Physical  Dana Escobar is a 37 y.o. G2P1011 presenting for D&C hysteroscopy, polypectomy. Patient with irregular spotting between menses with recent cycles. Somewhat heavier periods the last few years. With endometrial biopsy that showed benign polyp, secretory endometrium. Here for surgical management of endometrial polyp.      Past Medical History:  Diagnosis Date  . Anxiety   . Depression 02/28/2012  . Fibromyalgia     History reviewed. No pertinent surgical history.  OB History  Gravida Para Term Preterm AB Living  _0 SAB TAB Ectopic Multiple Live Births    1     1    # Outcome Date GA Lbr Len/2nd Weight Sex Delivery Anes PTL Lv  2 TAB           1 Term      Vag-Spont   LIV    Social History   Socioeconomic History  . Marital status: Married    Spouse name: Not on file  . Number of children: Not on file  . Years of education: Not on file  . Highest education level: Not on file  Occupational History  . Not on file  Social Needs  . Financial resource strain: Not on file  . Food insecurity:    Worry: Not on file    Inability: Not on file  . Transportation needs:    Medical: Not on file    Non-medical: Not on file  Tobacco Use  . Smoking status: Never Smoker  . Smokeless tobacco: Never Used  Substance and Sexual Activity  . Alcohol use: Yes    Alcohol/week: 0.0 oz    Comment: ocassionally  . Drug use: No  . Sexual activity: Not Currently    Partners: Male    Birth control/protection: Condom  Lifestyle  . Physical activity:    Days per week: Not on file    Minutes per session: Not on file  . Stress: Not on file  Relationships  . Social connections:    Talks on phone: Not on file    Gets together: Not on file    Attends religious service: Not on file    Active member of club or organization: Not on file    Attends meetings of clubs or organizations: Not on file    Relationship status: Not on file  Other Topics Concern    . Not on file  Social History Narrative  . Not on file    Family History  Problem Relation Age of Onset  . Breast cancer Mother 77  . Breast cancer Unknown   . Breast cancer Maternal Grandmother   . Hypertension Maternal Grandfather   . Diabetes Paternal Grandmother     Medications Prior to Admission  Medication Sig Dispense Refill Last Dose  . acetaminophen (TYLENOL) 500 MG tablet Take 1,000 mg by mouth every 6 (six) hours as needed for moderate pain.   Past Week at Unknown time  . ALPRAZolam (XANAX) 0.5 MG tablet Take 1 tablet (0.5 mg total) by mouth at bedtime as needed for anxiety. 30 tablet 2 03/22/2018 at 1100  . Lysine 500 MG CAPS Take 1 capsule by mouth daily.   Past Week at Unknown time  . Multiple Vitamin (MULTIVITAMIN) capsule Take 1 capsule by mouth daily.   Past Week at Unknown time    No Known Allergies  Review of Systems: Negative except for what is mentioned in HPI.     Physical  Exam: BP (!) 122/95   Pulse (!) 106   Temp 98.2 F (36.8 C) (Oral)   Resp 20   Ht _0  (1.626 m)   Wt 169 lb (76.7 kg)   LMP 02/15/2018 (Exact Date) Comment: has been bleeding since 7/3  SpO2 100%   BMI 29.01 kg/m  CONSTITUTIONAL: Well-developed, well-nourished female in no acute distress.  HENT:  Normocephalic, atraumatic, External right and left ear normal. Oropharynx is clear and moist EYES: Conjunctivae and EOM are normal. Pupils are equal, round, and reactive to light. No scleral icterus.  NECK: Normal range of motion, supple, no masses SKIN: Skin is warm and dry. No rash noted. Not diaphoretic. No erythema. No pallor. [place]: Alert and oriented to person, place, and time. Normal reflexes, muscle tone coordination. No cranial nerve deficit noted. PSYCHIATRIC: Normal mood and affect. Normal behavior. Normal judgment and thought content. CARDIOVASCULAR: Normal heart rate noted, regular rhythm RESPIRATORY: Effort and breath sounds normal, no problems with respiration  noted ABDOMEN: Soft, nontender, nondistended, gravid.  PELVIC: Deferred MUSCULOSKELETAL: Normal range of motion. No edema and no tenderness. 2+ distal pulses.   Pertinent Labs/Studies:   Results for orders placed or performed during the hospital encounter of 03/22/18 (from the past 72 hour(s))  Basic metabolic panel     Status: None   Collection Time: 03/20/18 11:00 AM  Result Value Ref Range   Sodium 137 135 - 145 mmol/L   Potassium 4.4 3.5 - 5.1 mmol/L   Chloride 102 98 - 111 mmol/L   CO2 25 22 - 32 mmol/L   Glucose, Bld 94 70 - 99 mg/dL   BUN 10 6 - 20 mg/dL   Creatinine, Ser 0.87 0.44 - 1.00 mg/dL   Calcium 9.2 8.9 - 10.3 mg/dL   GFR calc non Af Amer >60 >60 mL/min   GFR calc Af Amer >60 >60 mL/min    Comment: (NOTE) The eGFR has been calculated using the CKD EPI equation. This calculation has not been validated in all clinical situations. eGFR's persistently <60 mL/min signify possible Chronic Kidney Disease.    Anion gap 10 5 - 15    Comment: Performed at [facility] [address], [place], [place] [address]  CBC     Status: Abnormal   Collection Time: 03/20/18 11:00 AM  Result Value Ref Range   WBC 4.8 4.0 - 10.5 K/uL   RBC 3.59 (L) 3.87 - 5.11 MIL/uL   Hemoglobin 10.6 (L) 12.0 - 15.0 g/dL   HCT 32.8 (L) 36.0 - 46.0 %   MCV 91.4 78.0 - 100.0 fL   MCH 29.5 26.0 - 34.0 pg   MCHC 32.3 30.0 - 36.0 g/dL   RDW 13.9 11.5 - 15.5 %   Platelets 329 150 - 400 K/uL    Comment: Performed at [facility], [place] [address], [place], [place] [address]  hCG, quantitative, pregnancy     Status: None   Collection Time: 03/20/18 11:00 AM  Result Value Ref Range   hCG, Beta Chain, Quant, S <1 <5 mIU/mL    Comment:          GEST. AGE      CONC.  (mIU/mL)   <=1 WEEK        5 - 50     2 WEEKS       50 - 500     3 WEEKS       100 - 10,000     4 WEEKS  1,000 - 30,000     5 WEEKS     3,500 - 115,000   6-8 WEEKS     12,000 - 270,000    12 WEEKS     15,000 - 220,000         FEMALE AND NON-PREGNANT FEMALE:     LESS THAN 5 mIU/mL Performed at [facility], [place] [address] [place], [place] [address]   Pregnancy, urine POC     Status: None   Collection Time: 03/22/18 12:43 PM  Result Value Ref Range   Preg Test, Ur NEGATIVE NEGATIVE    Comment:        THE SENSITIVITY OF THIS METHODOLOGY IS >24 mIU/mL        Assessment and Plan :Dana Escobar is a 37 y.o. G2P1011 admitted for surgical management of D&C hysteroscopy, polypectomy. She was counseled regarding possibility that polyp noted on EMB may be causing intermenstrual spotting, that heavier menses may be due to polyp or ovulatory disorder. She desires to proceed with removal of polyp. The risks of hysteroscopic polypectomy with D&C were reviewed with the patient; including but not limited to: infection which may require antibiotics; bleeding which may require transfusion or re-operation; injury to bowel, bladder, ureters or other surrounding organs; need for additional procedures including hysterectomy in the event of a life-threatening hemorrhage; placental abnormalities wth subsequent pregnancies, thromboembolic phenomenon and other postoperative/anesthesia complications. Reviewed that all sampling will be sent to pathology and further management based on findings. The patient concurred with the proposed plan, giving informed consent for the procedure. She is agreeable to a blood transfusion in the event of emergency.  Patient is NPO Anesthesia aware SCDs  Admission labs To OR when ready   K. Arvilla Meres, M.D. Attending [place], [facility] for [employer], [place]

## 2018-03-22 NOTE — Anesthesia Postprocedure Evaluation (Signed)
Anesthesia Post Note  Patient: Dana Escobar  Procedure(s) Performed: DILATATION & CURETTAGE/HYSTEROSCOPY WITH MYOSURE (N/A Uterus)     Patient location during evaluation: PACU Anesthesia Type: General Level of consciousness: awake and alert Pain management: pain level controlled Vital Signs Assessment: post-procedure vital signs reviewed and stable Respiratory status: spontaneous breathing, nonlabored ventilation and respiratory function stable Cardiovascular status: blood pressure returned to baseline and stable Postop Assessment: no apparent nausea or vomiting Anesthetic complications: no    Last Vitals:  Vitals:   03/22/18 1415 03/22/18 1430  BP: 119/81 118/85  Pulse: (!) 120 99  Resp: 19 (!) 22  Temp:    SpO2: 99% 100%    Last Pain:  Vitals:   03/22/18 1415  TempSrc:   PainSc: 5                  Lowella CurbWarren Ray Kaitelyn Jamison

## 2018-03-22 NOTE — Transfer of Care (Signed)
Immediate Anesthesia Transfer of Care Note  Patient: Dana Escobar  Procedure(s) Performed: DILATATION & CURETTAGE/HYSTEROSCOPY WITH MYOSURE (N/A Uterus)  Patient Location: PACU  Anesthesia Type:General  Level of Consciousness: awake and patient cooperative  Airway & Oxygen Therapy: Patient Spontanous Breathing and Patient connected to face mask oxygen  Post-op Assessment: Report given to RN and Post -op Vital signs reviewed and stable  Post vital signs: Reviewed and stable  Last Vitals:  Vitals Value Taken Time  BP 123/81 03/22/2018  2:01 PM  Temp    Pulse 114 03/22/2018  2:02 PM  Resp 21 03/22/2018  2:02 PM  SpO2 100 % 03/22/2018  2:02 PM  Vitals shown include unvalidated device data.  Last Pain:  Vitals:   03/22/18 1139  TempSrc: Oral  PainSc: 0-No pain         Complications: No apparent anesthesia complications

## 2018-03-22 NOTE — Anesthesia Procedure Notes (Signed)
Procedure Name: LMA Insertion Date/Time: 03/22/2018 1:08 PM Performed by: [place] DesanctisLinka, Amal Renbarger L, CRNA Pre-anesthesia Checklist: Patient identified, Emergency Drugs available, Suction available and Patient being monitored Patient Re-evaluated:Patient Re-evaluated prior to induction Oxygen Delivery Method: Circle system utilized Preoxygenation: Pre-oxygenation with 100% oxygen Induction Type: IV induction Ventilation: Mask ventilation without difficulty LMA: LMA inserted LMA Size: 4.0 Number of attempts: 2 Airway Equipment and Method: Bite block Placement Confirmation: positive ETCO2 Tube secured with: Tape Dental Injury: Teeth and Oropharynx as per pre-operative assessment

## 2018-03-22 NOTE — Anesthesia Preprocedure Evaluation (Signed)
Anesthesia Evaluation  Patient identified by MRN, date of birth, ID band Patient awake    Reviewed: Allergy & Precautions, NPO status , Patient's Chart, lab work & pertinent test results  Airway Mallampati: II  TM Distance: >3 FB Neck ROM: Full    Dental no notable dental hx.    Pulmonary neg pulmonary ROS,    Pulmonary exam normal breath sounds clear to auscultation       Cardiovascular negative cardio ROS Normal cardiovascular exam Rhythm:Regular Rate:Normal     Neuro/Psych Anxiety Depression negative neurological ROS  negative psych ROS   GI/Hepatic negative GI ROS, Neg liver ROS,   Endo/Other  negative endocrine ROS  Renal/GU negative Renal ROS  negative genitourinary   Musculoskeletal negative musculoskeletal ROS (+) Fibromyalgia -  Abdominal   Peds negative pediatric ROS (+)  Hematology negative hematology ROS (+)   Anesthesia Other Findings   Reproductive/Obstetrics negative OB ROS                             Anesthesia Physical Anesthesia Plan  ASA: II  Anesthesia Plan: General   Post-op Pain Management:    Induction: Intravenous  PONV Risk Score and Plan: 3 and Ondansetron, Dexamethasone and Midazolam  Airway Management Planned: LMA  Additional Equipment:   Intra-op Plan:   Post-operative Plan: Extubation in OR  Informed Consent: I have reviewed the patients History and Physical, chart, labs and discussed the procedure including the risks, benefits and alternatives for the proposed anesthesia with the patient or authorized representative who has indicated his/her understanding and acceptance.   Dental advisory given  Plan Discussed with: CRNA  Anesthesia Plan Comments:         Anesthesia Quick Evaluation

## 2018-03-22 NOTE — Op Note (Signed)
Dana Escobar PROCEDURE DATE: 03/22/2018   PREOPERATIVE DIAGNOSIS:  abnormal uterine bleeding, endometrial polyp  POSTOPERATIVE DIAGNOSIS: same  PROCEDURE: Operative Hysteroscopic polypectomy, dilation and curettage  SURGEON:  Dr. Baldemar LenisK. Meryl Davis  ANESTHESIOLOGY TEAM: Anesthesiologist: Lowella CurbMiller, Warren Ray, MD CRNA: Gar GibbonKeeton, Dennis S, CRNA; [place] DesanctisLinka, Margaret L, CRNA  INDICATIONS: 37 y.o. [phone]  here for scheduled surgery for the aforementioned diagnoses.   Risks of surgery were discussed with the patient including but not limited to: bleeding which may require transfusion; infection which may require antibiotics; injury to uterus or surrounding organs; intrauterine scarring which may impair future fertility; need for additional procedures including laparotomy or laparoscopy; and other postoperative/anesthesia complications. Written informed consent was obtained.    FINDINGS: Normal appearing female genitalia with multiparous normal appearing cervix.  9 weeks size uterus. Large polyp filling endometrial cavity. Diffuse fluffy proliferative endometrium.  Normal ostia on left, right ostia not visualized due to endometrial tissue.  ANESTHESIA:   General INTRAVENOUS FLUIDS:  1000 ml of LR FLUID DEFICITS:  55ml of normal saline ESTIMATED BLOOD LOSS: Less than 20 ml URINE OUTPUT: 25 mL clear yellow urine SPECIMENS: endometrial polyp, endometrial curettings sent to pathology COMPLICATIONS:  None immediate.  PROCEDURE DETAILS:  The patient was seen in the pre-op area where the plan was reviewed and she again verbalized understanding and consent.  She was then taken to the operating room where general anesthesia was administered.  After an adequate timeout was performed, she was placed in the dorsal lithotomy position and examined; then prepped and draped in the sterile manner. A straight catheter was inserted into the bladder sterilely and the bladder was drained.  A weighted speculum was then placed  in the patient's vagina and a Sims retractor used to visualize the cervix. A single toothed tenaculum was applied to the anterior lip of the cervix.  The cervix then dilated manually with metal dilators to accommodate the 6 mm operative hysteroscope.  Once the cervix was dilated, the hysteroscope was inserted under direct visualization using normal saline as a suspension medium.  The uterine cavity was carefully examined with the findings as noted above. The operative channel was introduced into the cavity and the polyp was removed using the myosure reach device under direct visualization. The polyp came free easily with minimal pressure and the cavity was visualized with several areas of fluffy tissue sampled using the reach device. Once this was completed, the cavity was again viewed and noted to be free of other pathology. The hysteroscope was then removed under direct visualization.  A sharp curettage was then performed to obtain a significant amount of endometrial curettings.  The tenaculum was removed from the anterior lip of the cervix with good hemostasis noted at the tenaculum site after application of pressure and silver nitrate. All instruments were removed from the vagina.  The patient tolerated the procedure well and was taken to the recovery area awake, extubated and in stable condition.  The patient will be discharged to home as per PACU criteria.  Routine postoperative instructions given.  She was prescribed Percocet, Ibuprofen and Colace.  She will follow up in the clinic in 2 weeks  for postoperative evaluation.    Baldemar LenisK. Meryl Davis, M.D. Attending Obstetrician & Gynecologist, [facility] for [facility], [facility]

## 2018-03-23 ENCOUNTER — Encounter (HOSPITAL_BASED_OUTPATIENT_CLINIC_OR_DEPARTMENT_OTHER): Payer: Self-pay | Admitting: Obstetrics and Gynecology

## 2018-04-10 ENCOUNTER — Encounter: Payer: Self-pay | Admitting: Obstetrics and Gynecology

## 2018-04-10 ENCOUNTER — Ambulatory Visit (INDEPENDENT_AMBULATORY_CARE_PROVIDER_SITE_OTHER): Payer: 59 | Admitting: Obstetrics and Gynecology

## 2018-04-10 VITALS — BP 119/82 | HR 83 | Wt 168.8 lb

## 2018-04-10 DIAGNOSIS — Z9889 Other specified postprocedural states: Secondary | ICD-10-CM

## 2018-04-10 DIAGNOSIS — N840 Polyp of corpus uteri: Secondary | ICD-10-CM

## 2018-04-10 NOTE — Progress Notes (Signed)
Pt is here for post op appt following operative hysteroscopic polypectomy, D&C. Pt reports no pain and no bleeding.

## 2018-04-10 NOTE — Progress Notes (Signed)
   GYNECOLOGY OFFICE FOLLOW UP NOTE  History:  37 y.o. G2P1011 here today for follow up for D&C hysteroscopy, polypectomy on 03/22/18. Bled for a couple of days after surgery. Had pain for about a week that was manageable with motrin. Denies bowel, bladder issues, denies nausea/vomiting. Has not had period yet but thinks it is coming on.     Past Medical History:  Diagnosis Date  . Anxiety   . Depression 02/28/2012  . Fibromyalgia     Past Surgical History:  Procedure Laterality Date  . DILATATION & CURETTAGE/HYSTEROSCOPY WITH MYOSURE N/A 03/22/2018   Procedure: DILATATION & CURETTAGE/HYSTEROSCOPY WITH MYOSURE;  Surgeon: Conan Bowensavis, Kelly M, MD;  Location: [facility];  Service: Gynecology;  Laterality: N/A;     Current Outpatient Medications:  .  ALPRAZolam (XANAX) 0.5 MG tablet, Take 1 tablet (0.5 mg total) by mouth at bedtime as needed for anxiety., Disp: 30 tablet, Rfl: 2 .  ibuprofen (ADVIL,MOTRIN) 600 MG tablet, Take 1 tablet (600 mg total) by mouth every 6 (six) hours as needed., Disp: 60 tablet, Rfl: 1 .  acetaminophen (TYLENOL) 500 MG tablet, Take 1,000 mg by mouth every 6 (six) hours as needed for moderate pain., Disp: , Rfl:  .  Lysine 500 MG CAPS, Take 1 capsule by mouth daily., Disp: , Rfl:  .  Multiple Vitamin (MULTIVITAMIN) capsule, Take 1 capsule by mouth daily., Disp: , Rfl:  .  oxyCODONE-acetaminophen (PERCOCET/ROXICET) 5-325 MG tablet, Take 1 tablet by mouth every 6 (six) hours as needed. (Patient not taking: Reported on 04/10/2018), Disp: 20 tablet, Rfl: 0  The following portions of the patient's history were reviewed and updated as appropriate: allergies, current medications, past family history, past medical history, past social history, past surgical history and problem list.   Review of Systems:  Pertinent items noted in HPI and remainder of comprehensive ROS otherwise negative.   Objective:  Physical Exam BP 119/82   Pulse 83   Wt 168 lb 12.8 oz (76.6  kg)   BMI 28.97 kg/m  CONSTITUTIONAL: Well-developed, well-nourished female in no acute distress.  HENT:  Normocephalic, atraumatic. External right and left ear normal. Oropharynx is clear and moist EYES: Conjunctivae and EOM are normal. Pupils are equal, round, and reactive to light. No scleral icterus.  NECK: Normal range of motion, supple, no masses SKIN: Skin is warm and dry. No rash noted. Not diaphoretic. No erythema. No pallor. NEUROLOGIC: Alert and oriented to person, place, and time. Normal reflexes, muscle tone coordination. No cranial nerve deficit noted. PSYCHIATRIC: Normal mood and affect. Normal behavior. Normal judgment and thought content. CARDIOVASCULAR: Normal heart rate noted RESPIRATORY: Effort normal, no problems with respiration noted ABDOMEN: Soft, no distention noted.   PELVIC: deferred MUSCULOSKELETAL: Normal range of motion. No edema noted.  Labs and Imaging Diagnosis Endometrium, curettage, and polyp - ENDOMETRIOID-TYPE POLYP(S). - THERE IS NO EVIDENCE OF HYPERPLASIA OR MALIGNANCY. Pecola LeisureJOSHUA KISH MD Pathologist, Electronic Signature (Case signed 03/23/2018)  Assessment & Plan:  1. Post-operative state Doing well No issues  2. Endometrial polyp Reviewed benign path   Routine preventative health maintenance measures emphasized. Please refer to After Visit Summary for other counseling recommendations.   Return in about 1 year (around 04/11/2019), or if symptoms worsen or fail to improve, for annual.   Baldemar LenisK. Meryl Davis, M.D. Center for [employer] [employer]

## 2018-04-11 ENCOUNTER — Other Ambulatory Visit: Payer: Self-pay | Admitting: Obstetrics

## 2018-04-11 DIAGNOSIS — Z1231 Encounter for screening mammogram for malignant neoplasm of breast: Secondary | ICD-10-CM

## 2018-04-21 ENCOUNTER — Ambulatory Visit
Admission: RE | Admit: 2018-04-21 | Discharge: 2018-04-21 | Disposition: A | Discharge status: Home / Self Care | Payer: 59 | Source: Ambulatory Visit | Attending: Obstetrics | Admitting: Obstetrics

## 2018-04-21 DIAGNOSIS — Z1231 Encounter for screening mammogram for malignant neoplasm of breast: Secondary | ICD-10-CM

## 2018-04-25 ENCOUNTER — Other Ambulatory Visit: Payer: Self-pay | Admitting: Obstetrics

## 2018-04-25 DIAGNOSIS — R928 Other abnormal and inconclusive findings on diagnostic imaging of breast: Secondary | ICD-10-CM

## 2018-04-27 ENCOUNTER — Ambulatory Visit
Admission: RE | Admit: 2018-04-27 | Discharge: 2018-04-27 | Disposition: A | Discharge status: Home / Self Care | Payer: 59 | Source: Ambulatory Visit | Attending: Obstetrics | Admitting: Obstetrics

## 2018-04-27 ENCOUNTER — Other Ambulatory Visit: Payer: 59

## 2018-04-27 DIAGNOSIS — R928 Other abnormal and inconclusive findings on diagnostic imaging of breast: Secondary | ICD-10-CM

## 2018-05-15 ENCOUNTER — Encounter: Payer: Self-pay | Admitting: Obstetrics

## 2018-05-15 ENCOUNTER — Ambulatory Visit: Payer: 59 | Admitting: Obstetrics

## 2018-05-15 VITALS — BP 132/93 | HR 103 | Ht 65.0 in | Wt 168.0 lb

## 2018-05-15 DIAGNOSIS — B9689 Other specified bacterial agents as the cause of diseases classified elsewhere: Secondary | ICD-10-CM | POA: Diagnosis not present

## 2018-05-15 DIAGNOSIS — N760 Acute vaginitis: Secondary | ICD-10-CM

## 2018-05-15 DIAGNOSIS — Z23 Encounter for immunization: Secondary | ICD-10-CM | POA: Diagnosis not present

## 2018-05-15 DIAGNOSIS — Z113 Encounter for screening for infections with a predominantly sexual mode of transmission: Secondary | ICD-10-CM

## 2018-05-15 DIAGNOSIS — Z30011 Encounter for initial prescription of contraceptive pills: Secondary | ICD-10-CM | POA: Diagnosis not present

## 2018-05-15 MED ORDER — TINIDAZOLE 500 MG PO TABS: 1000.0000 mg | ORAL_TABLET | Freq: Every day | ORAL | 5 refills | Status: DC

## 2018-05-15 MED ORDER — LO LOESTRIN FE 1 MG-10 MCG / 10 MCG PO TABS: 1.0000 | ORAL_TABLET | Freq: Every day | ORAL | 4 refills | Status: DC

## 2018-05-15 NOTE — Progress Notes (Signed)
Subjective:    Dana Escobar is a 37 y.o. female who presents for contraception counseling. The patient has had heavy and prolonged periods  She is s/p Hysteroscopy / D&C for suspected endometrial polyp on ultrasound. The patient is sexually active. Pertinent past medical history: none.  The information documented in the HPI was reviewed and verified.  Menstrual History: OB History    Gravida  2   Para  1   Term  1   Preterm      AB  1   Living  1     SAB      TAB  1   Ectopic      Multiple      Live Births  1            Patient's last menstrual period was 05/13/2018.   Patient Active Problem List   Diagnosis Date Noted  . Endometrial polyp 03/22/2018  . Lump or mass in breast 12/20/2013  . Skin abnormality 08/21/2013  . Dysmenorrhea 07/05/2013  . Anxiety disorder 07/05/2013  . BV (bacterial vaginosis) 07/05/2013  . UTI (urinary tract infection) 06/19/2013  . Depression 02/28/2012   Past Medical History:  Diagnosis Date  . Anxiety   . Depression 02/28/2012  . Fibromyalgia     Past Surgical History:  Procedure Laterality Date  . DILATATION & CURETTAGE/HYSTEROSCOPY WITH MYOSURE N/A 03/22/2018   Procedure: DILATATION & CURETTAGE/HYSTEROSCOPY WITH MYOSURE;  Surgeon: Conan Bowens, MD;  Location: [facility];  Service: Gynecology;  Laterality: N/A;     Current Outpatient Medications:  .  acetaminophen (TYLENOL) 500 MG tablet, Take 1,000 mg by mouth every 6 (six) hours as needed for moderate pain., Disp: , Rfl:  .  ALPRAZolam (XANAX) 0.5 MG tablet, Take 1 tablet (0.5 mg total) by mouth at bedtime as needed for anxiety., Disp: 30 tablet, Rfl: 2 .  ibuprofen (ADVIL,MOTRIN) 600 MG tablet, Take 1 tablet (600 mg total) by mouth every 6 (six) hours as needed., Disp: 60 tablet, Rfl: 1 .  LO LOESTRIN FE 1 MG-10 MCG / 10 MCG tablet, Take 1 tablet by mouth daily., Disp: 3 Package, Rfl: 4 .  Lysine 500 MG CAPS, Take 1 capsule by mouth daily., Disp: , Rfl:   .  Multiple Vitamin (MULTIVITAMIN) capsule, Take 1 capsule by mouth daily., Disp: , Rfl:  .  oxyCODONE-acetaminophen (PERCOCET/ROXICET) 5-325 MG tablet, Take 1 tablet by mouth every 6 (six) hours as needed. (Patient not taking: Reported on 04/10/2018), Disp: 20 tablet, Rfl: 0 .  tinidazole (TINDAMAX) 500 MG tablet, Take 2 tablets (1,000 mg total) by mouth daily with breakfast., Disp: 10 tablet, Rfl: 5 No Known Allergies  Social History   Tobacco Use  . Smoking status: Never Smoker  . Smokeless tobacco: Never Used  Substance Use Topics  . Alcohol use: Yes    Alcohol/week: 0.0 standard drinks    Comment: ocassionally    Family History  Problem Relation Age of Onset  . Breast cancer Mother 34  . Breast cancer Maternal Grandmother   . Hypertension Maternal Grandfather   . Diabetes Paternal Grandmother        Review of Systems Constitutional: negative for weight loss Genitourinary:POSITIVE for abnormal menstrual periods and vaginal discharge   Objective:   BP (!) 132/93   Pulse (!) 103   Ht 5\' 5"  (1.651 m)   Wt 168 lb (76.2 kg)   LMP 05/13/2018   BMI 27.96 kg/m    PE;  Deferred  Lab Review Urine pregnancy test Labs reviewed yes Radiologic studies reviewed yes  >50% of 15 min visit spent on counseling and coordination of care.    Assessment:    37 y.o., starting OCP (estrogen/progesterone), no contraindications.   Plan:    All questions answered. Contraception: OCP (estrogen/progesterone). Discussed healthy lifestyle modifications. Follow up in 3 months.    Meds ordered this encounter  Medications  . LO LOESTRIN FE 1 MG-10 MCG / 10 MCG tablet    Sig: Take 1 tablet by mouth daily.    Dispense:  3 Package    Refill:  4    Submit other coverage code 3  BIN:  [id number]  PCN:  [id number]   GRP:  [id number]   ID:  [id number]  . tinidazole (TINDAMAX) 500 MG tablet    Sig: Take 2 tablets (1,000 mg total) by mouth daily with breakfast.    Dispense:  10 tablet    Refill:   5   Orders Placed This Encounter  Procedures  . Flu Vaccine QUAD 36+ mos IM (Fluarix, Quad PF)  . HepB+HepC+HIV Panel  . RPR    Brock Bad MD 05-15-2018

## 2018-05-15 NOTE — Progress Notes (Signed)
Presents for [employer], she has heavy periods after [facility] & Hysteroscopy.  Wants STD Panel.

## 2018-05-16 ENCOUNTER — Telehealth: Payer: Self-pay | Admitting: *Deleted

## 2018-05-16 NOTE — Telephone Encounter (Signed)
Pt called to office for lab results. Return call to pt. Pt made aware that labs are not yet resulted. Advised pt she may call back tomorrow if she would like to check for results.

## 2018-05-17 LAB — HEPB+HEPC+HIV PANEL
HEP B C IGM: NEGATIVE
HEP B S AG: NEGATIVE
HIV SCREEN 4TH GENERATION: NONREACTIVE
Hep B Core Total Ab: NEGATIVE
Hep B E Ab: NEGATIVE
Hep B E Ag: NEGATIVE
Hep B Surface Ab, Qual: NONREACTIVE

## 2018-05-17 LAB — RPR: RPR Ser Ql: NONREACTIVE

## 2018-06-24 IMAGING — US US PELVIS COMPLETE TRANSABD/TRANSVAG
1 series · 13 of 25 positions shown · non-contrast
Comparison: None

CLINICAL DATA: Patient with dysfunctional uterine bleeding.



[Series 1: us pelvis complete transabd/transvag · 0.28mm/px · 13 of 73 slices shown]
[im 1/73]
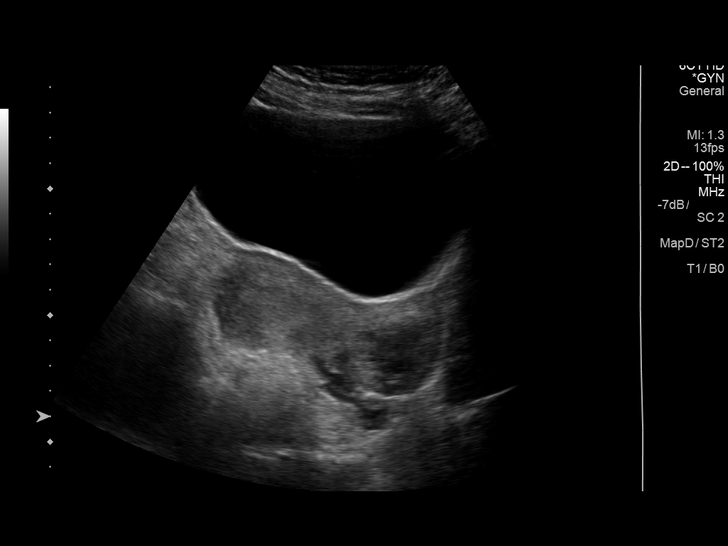
[im 7/73]
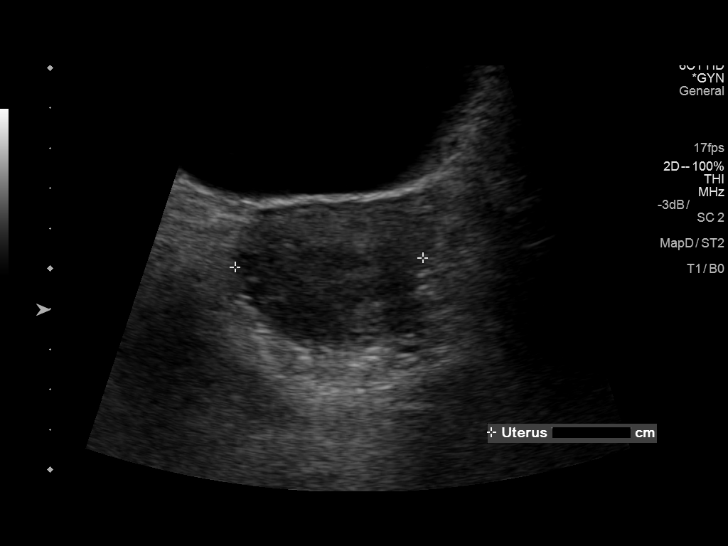
[im 13/73]
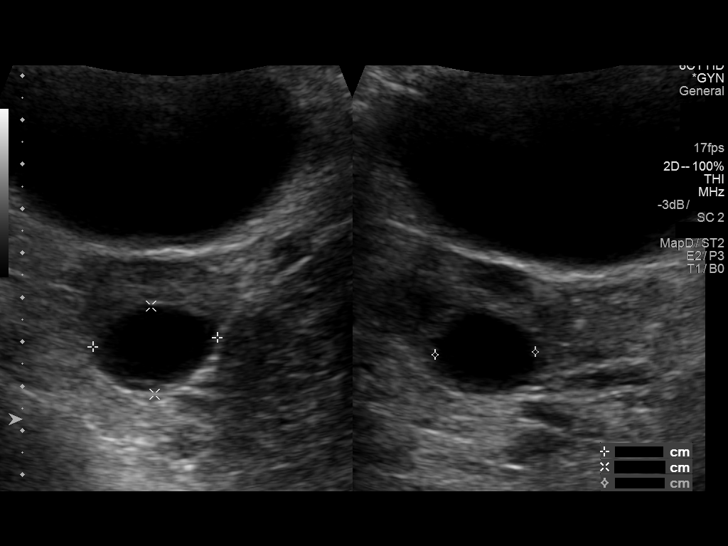
[im 19/73]
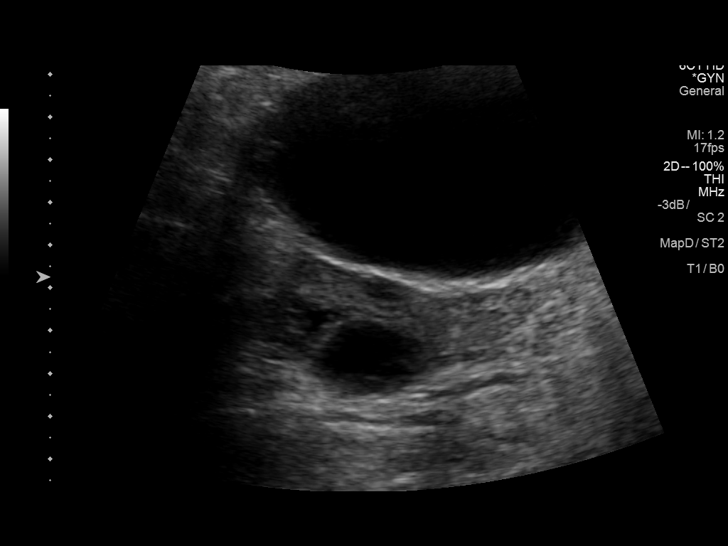
[im 25/73]
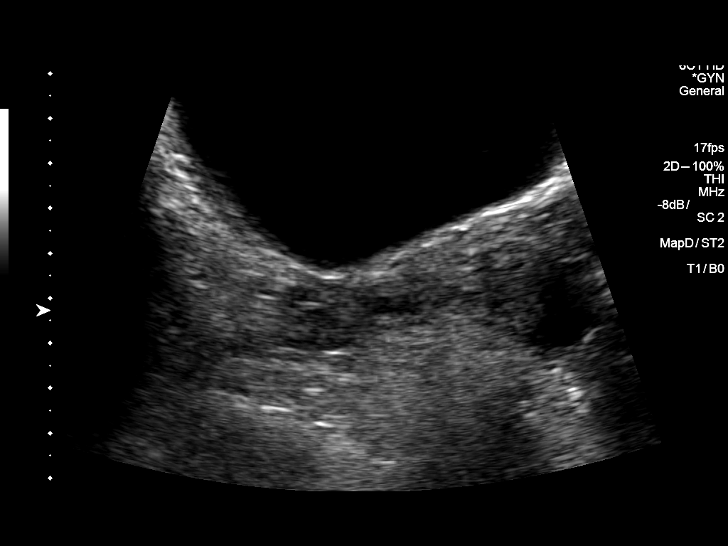
[im 31/73]
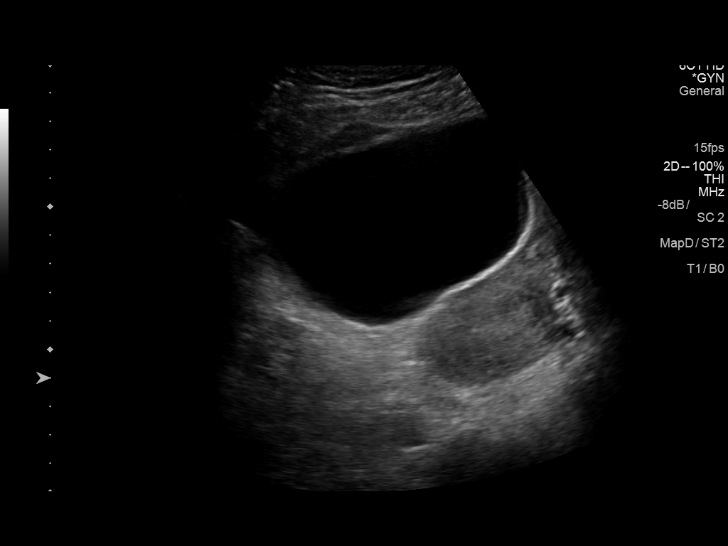
[im 37/73]
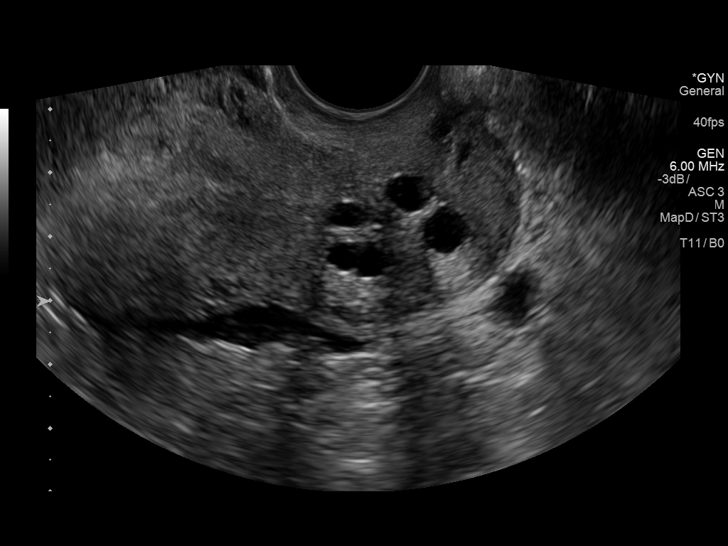
[im 43/73]
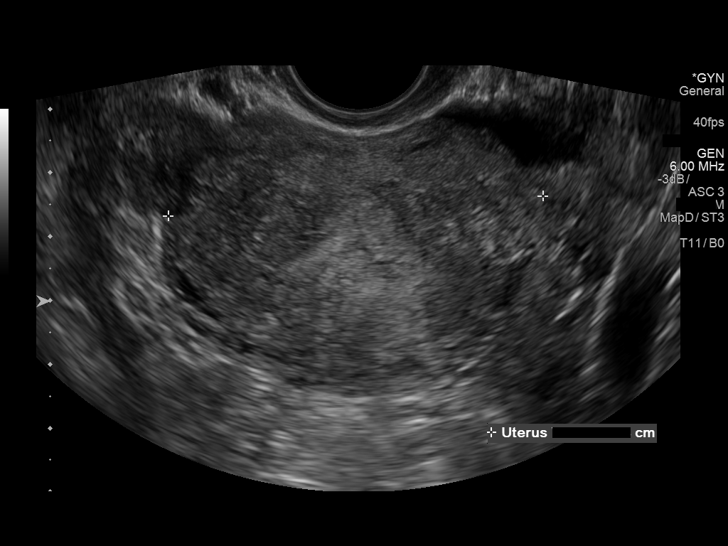
[im 49/73]
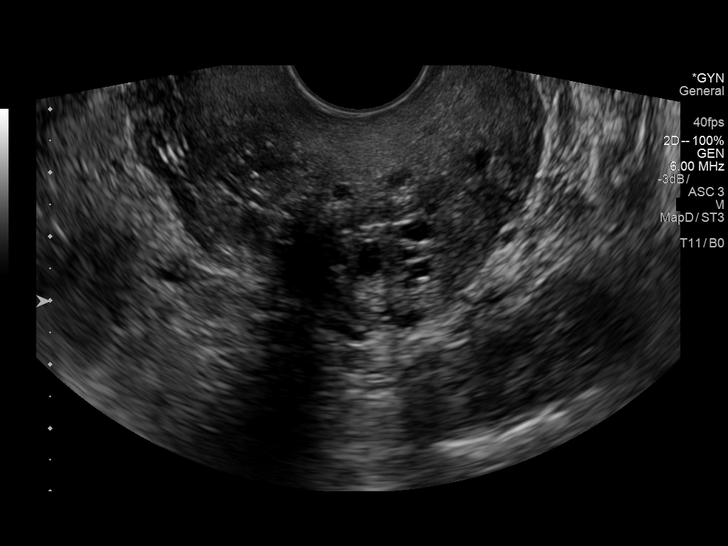
[im 55/73]
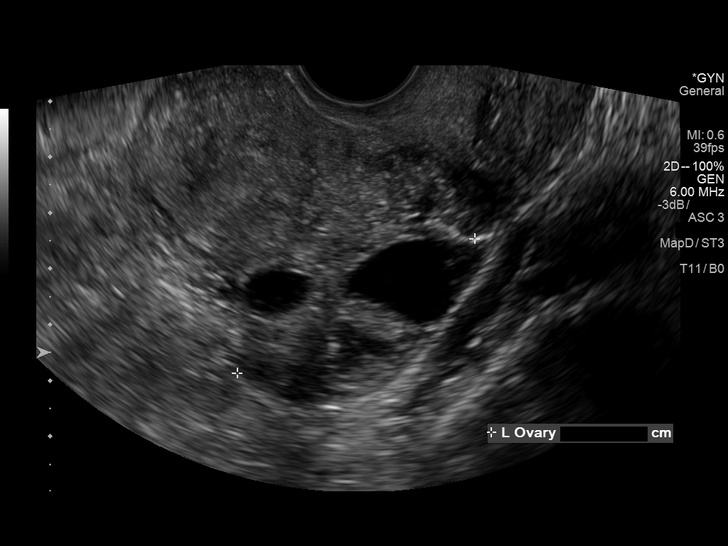
[im 61/73]
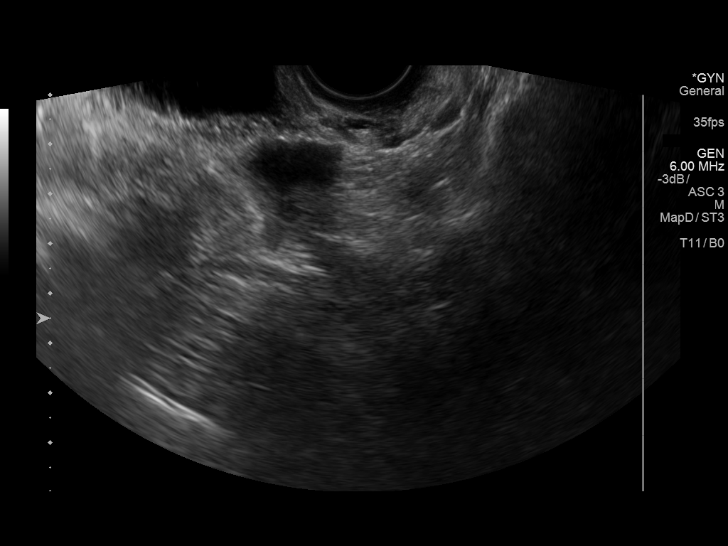
[im 67/73]
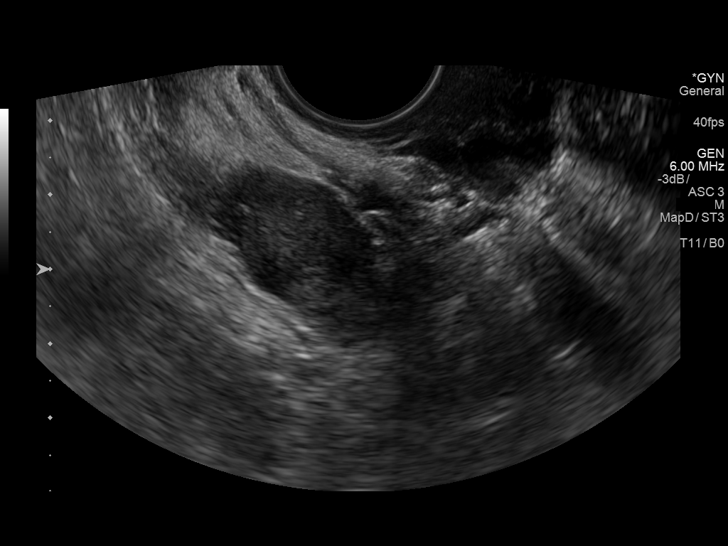
[im 73/73]
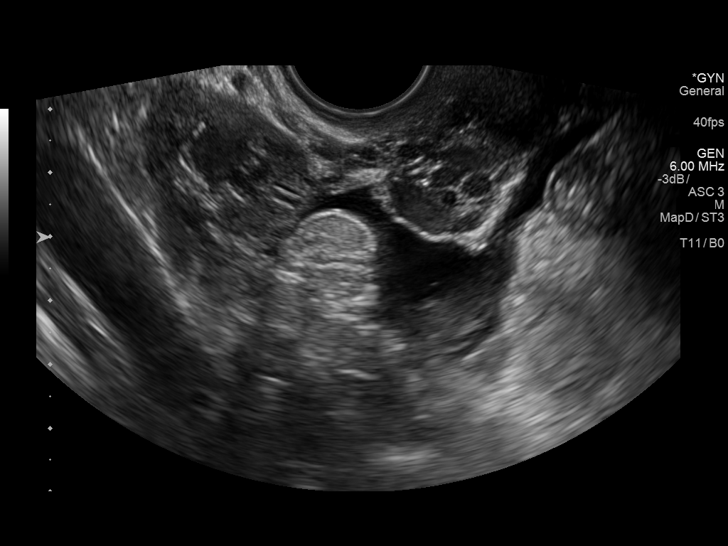

[13 of 25 positions shown; findings below may reference images not displayed]

FINDINGS: Uterus

Measurements: 9.3 x 4.0 x 4.7 cm. No fibroids or other mass
visualized.

Endometrium

Thickness: 16 mm.  No focal abnormality visualized.

Right ovary

Measurements: 3.0 x 3.1 x 1.9 cm. Normal appearance/no adnexal mass.

Left ovary

Measurements: 4.9 x 2.6 x 3.7 cm. Prominent follicles within the
left ovary.

Other findings

Trace fluid in the pelvis
IMPRESSION: Endometrium measures 16 mm. If bleeding remains unresponsive to
hormonal or medical therapy, focal lesion work-up with
sonohysterogram should be considered. Endometrial biopsy should also
be considered in pre-menopausal patients at high risk for
endometrial carcinoma. (Ref: Radiological Reasoning: Algorithmic
Workup of Abnormal Vaginal Bleeding with Endovaginal Sonography and
Sonohysterography. [id number]; 191:[id number])

## 2018-07-03 ENCOUNTER — Telehealth: Payer: Self-pay | Admitting: *Deleted

## 2018-07-03 NOTE — Telephone Encounter (Signed)
Fax from [facility] for LoLoEstrin to be sent to their pharmacy. Order was called to them with 1 year refills.

## 2018-08-14 ENCOUNTER — Other Ambulatory Visit: Payer: Self-pay | Admitting: Obstetrics

## 2018-08-14 DIAGNOSIS — N760 Acute vaginitis: Principal | ICD-10-CM

## 2018-08-14 DIAGNOSIS — B9689 Other specified bacterial agents as the cause of diseases classified elsewhere: Secondary | ICD-10-CM

## 2018-08-23 ENCOUNTER — Ambulatory Visit (INDEPENDENT_AMBULATORY_CARE_PROVIDER_SITE_OTHER): Payer: Managed Care, Other (non HMO)

## 2018-08-23 VITALS — BP 121/86 | HR 107 | Resp 16 | Ht 65.0 in | Wt 157.6 lb

## 2018-08-23 DIAGNOSIS — R109 Unspecified abdominal pain: Secondary | ICD-10-CM | POA: Diagnosis not present

## 2018-08-23 DIAGNOSIS — R358 Other polyuria: Secondary | ICD-10-CM | POA: Diagnosis not present

## 2018-08-23 DIAGNOSIS — N898 Other specified noninflammatory disorders of vagina: Secondary | ICD-10-CM

## 2018-08-23 DIAGNOSIS — R3589 Other polyuria: Secondary | ICD-10-CM

## 2018-08-23 DIAGNOSIS — Z113 Encounter for screening for infections with a predominantly sexual mode of transmission: Secondary | ICD-10-CM

## 2018-08-23 LAB — POCT URINALYSIS DIP (MANUAL ENTRY)
Bilirubin, UA: NEGATIVE
Blood, UA: NEGATIVE
GLUCOSE UA: NEGATIVE mg/dL
Ketones, POC UA: NEGATIVE mg/dL
Nitrite, UA: NEGATIVE
Protein Ur, POC: NEGATIVE mg/dL
Spec Grav, UA: 1.025 (ref 1.010–1.025)
Urobilinogen, UA: 0.2 E.U./dL
pH, UA: 6 (ref 5.0–8.0)

## 2018-08-23 NOTE — Progress Notes (Signed)
I have reviewed the chart and agree with nursing staff's documentation of this patient's encounter.  Catalina Antigua, MD 08/23/2018 3:25 PM

## 2018-08-23 NOTE — Progress Notes (Signed)
SUBJECTIVE: Dana Escobar is a 38 y.o. female who complains of urinary frequency, urgency, bilateral lower back pain for about a week. Patient request STD screening due to possible spouse infidelity. Patient reports white odorless vaginal discharge for about a week. Patient denies fever, chills, abdominal pain or abnormal bleeding.  OBJECTIVE: Appears well, in no apparent distress.  Vital signs are normal. Urine dipstick shows positive for leukocytes.    ASSESSMENT: Polyuria, Flank pain,   PLAN:  Treatment per orders. Patient advised she will be contacted by our office once lab results have been reviewed by a provider for recommendations/instructions. Call or return to clinic prn if these symptoms worsen or fail to improve as anticipated.

## 2018-08-25 LAB — CERVICOVAGINAL ANCILLARY ONLY
BACTERIAL VAGINITIS: NEGATIVE
CANDIDA VAGINITIS: NEGATIVE
CHLAMYDIA, DNA PROBE: NEGATIVE
Neisseria Gonorrhea: NEGATIVE
TRICH (WINDOWPATH): NEGATIVE

## 2018-08-26 LAB — URINE CULTURE

## 2018-08-28 ENCOUNTER — Other Ambulatory Visit: Payer: Self-pay | Admitting: Obstetrics and Gynecology

## 2018-08-28 MED ORDER — CEPHALEXIN 500 MG PO CAPS: 500.0000 mg | ORAL_CAPSULE | Freq: Three times a day (TID) | ORAL | 0 refills | Status: DC

## 2018-10-30 ENCOUNTER — Other Ambulatory Visit: Payer: Self-pay

## 2018-10-30 ENCOUNTER — Ambulatory Visit (INDEPENDENT_AMBULATORY_CARE_PROVIDER_SITE_OTHER): Payer: Managed Care, Other (non HMO) | Admitting: Obstetrics

## 2018-10-30 ENCOUNTER — Encounter: Payer: Self-pay | Admitting: Obstetrics

## 2018-10-30 VITALS — BP 136/96 | HR 109 | Ht 65.0 in | Wt 156.0 lb

## 2018-10-30 DIAGNOSIS — Z113 Encounter for screening for infections with a predominantly sexual mode of transmission: Secondary | ICD-10-CM | POA: Diagnosis not present

## 2018-10-30 DIAGNOSIS — N760 Acute vaginitis: Secondary | ICD-10-CM

## 2018-10-30 DIAGNOSIS — B373 Candidiasis of vulva and vagina: Secondary | ICD-10-CM | POA: Diagnosis not present

## 2018-10-30 DIAGNOSIS — Z124 Encounter for screening for malignant neoplasm of cervix: Secondary | ICD-10-CM | POA: Diagnosis not present

## 2018-10-30 DIAGNOSIS — N898 Other specified noninflammatory disorders of vagina: Secondary | ICD-10-CM | POA: Diagnosis not present

## 2018-10-30 DIAGNOSIS — B9689 Other specified bacterial agents as the cause of diseases classified elsewhere: Secondary | ICD-10-CM

## 2018-10-30 DIAGNOSIS — Z01419 Encounter for gynecological examination (general) (routine) without abnormal findings: Secondary | ICD-10-CM | POA: Diagnosis not present

## 2018-10-30 DIAGNOSIS — F411 Generalized anxiety disorder: Secondary | ICD-10-CM

## 2018-10-30 MED ORDER — ALPRAZOLAM 0.5 MG PO TABS
0.5000 mg | ORAL_TABLET | Freq: Every evening | ORAL | 2 refills | Status: DC | PRN
Start: 2018-10-30 — End: 2019-11-01

## 2018-10-30 MED ORDER — TINIDAZOLE 500 MG PO TABS: 1000.0000 mg | ORAL_TABLET | Freq: Every day | ORAL | 5 refills | Status: DC

## 2018-10-30 NOTE — Progress Notes (Signed)
Patient presents for Annual Exam today. Pt wants to discuss contraception management and possibly changing.   LMP: 10/25/18 Last pap:08/01/2017 Contraception: Pills  STD Screening: Desires Full panel  Mammogram 04/2018 and Breast U/S 04/27/2018

## 2018-10-30 NOTE — Progress Notes (Signed)
Subjective:        Dana Escobar is a 38 y.o. female here for a routine exam.  Current complaints: None.    Personal health questionnaire:  Is patient [name], have a family history of breast and/or ovarian cancer: yes Is there a family history of uterine cancer diagnosed at age < 60, gastrointestinal cancer, urinary tract cancer, family member who is a Personnel officer syndrome-associated carrier: no Is the patient overweight and hypertensive, family history of diabetes, personal history of gestational diabetes, preeclampsia or PCOS: no Is patient over 48, have PCOS,  family history of premature CHD under age 60, diabetes, smoke, have hypertension or peripheral artery disease:  no At any time, has a partner hit, kicked or otherwise hurt or frightened you?: no Over the past 2 weeks, have you felt down, depressed or hopeless?: no Over the past 2 weeks, have you felt little interest or pleasure in doing things?:no   Gynecologic History Patient's last menstrual period was 10/25/2018 (exact date). Contraception: OCP (estrogen/progesterone) Last Pap: 2018. Results were: normal Last mammogram: 2019. Results were: normal  Obstetric History OB History  Gravida Para Term Preterm AB Living  2 1 1   1 1   SAB TAB Ectopic Multiple Live Births    1     1    # Outcome Date GA Lbr Len/2nd Weight Sex Delivery Anes PTL Lv  2 TAB           1 Term      Vag-Spont   LIV    Past Medical History:  Diagnosis Date  . Anxiety   . Depression 02/28/2012  . Fibromyalgia     Past Surgical History:  Procedure Laterality Date  . DILATATION & CURETTAGE/HYSTEROSCOPY WITH MYOSURE N/A 03/22/2018   Procedure: DILATATION & CURETTAGE/HYSTEROSCOPY WITH MYOSURE;  Surgeon: Conan Bowens, MD;  Location: [facility];  Service: Gynecology;  Laterality: N/A;     Current Outpatient Medications:  .  acetaminophen (TYLENOL) 500 MG tablet, Take 1,000 mg by mouth every 6 (six) hours as needed for moderate  pain., Disp: , Rfl:  .  ALPRAZolam (XANAX) 0.5 MG tablet, Take 1 tablet (0.5 mg total) by mouth at bedtime as needed for anxiety., Disp: 30 tablet, Rfl: 2 .  ibuprofen (ADVIL,MOTRIN) 600 MG tablet, Take 1 tablet (600 mg total) by mouth every 6 (six) hours as needed., Disp: 60 tablet, Rfl: 1 .  LO LOESTRIN FE 1 MG-10 MCG / 10 MCG tablet, Take 1 tablet by mouth daily., Disp: 3 Package, Rfl: 4 .  Lysine 500 MG CAPS, Take 1 capsule by mouth daily., Disp: , Rfl:  .  cephALEXin (KEFLEX) 500 MG capsule, Take 1 capsule (500 mg total) by mouth 3 (three) times daily. (Patient not taking: Reported on 10/30/2018), Disp: 21 capsule, Rfl: 0 .  metroNIDAZOLE (METROGEL) 0.75 % vaginal gel, PLACE 1 APPLICATORFUL VAGINALLY 2 (TWO) TIMES DAILY., Disp: 70 g, Rfl: 4 .  oxyCODONE-acetaminophen (PERCOCET/ROXICET) 5-325 MG tablet, Take 1 tablet by mouth every 6 (six) hours as needed. (Patient not taking: Reported on 10/30/2018), Disp: 20 tablet, Rfl: 0 .  tinidazole (TINDAMAX) 500 MG tablet, Take 2 tablets (1,000 mg total) by mouth daily with breakfast., Disp: 10 tablet, Rfl: 5 No Known Allergies  Social History   Tobacco Use  . Smoking status: Never Smoker  . Smokeless tobacco: Never Used  Substance Use Topics  . Alcohol use: Yes    Alcohol/week: 0.0 standard drinks    Comment: ocassionally  Family History  Problem Relation Age of Onset  . Breast cancer Mother 54  . Breast cancer Maternal Grandmother   . Hypertension Maternal Grandfather   . Diabetes Paternal Grandmother       Review of Systems  Constitutional: negative for fatigue and weight loss Respiratory: negative for cough and wheezing Cardiovascular: negative for chest pain, fatigue and palpitations Gastrointestinal: negative for abdominal pain and change in bowel habits Musculoskeletal:negative for myalgias Neurological: negative for gait problems and tremors Behavioral/Psych: negative for abusive relationship, depression Endocrine: negative  for temperature intolerance    Genitourinary:negative for abnormal menstrual periods, genital lesions, hot flashes, sexual problems and vaginal discharge Integument/breast: negative for breast lump, breast tenderness, nipple discharge and skin lesion(s)    Objective:       BP (!) 136/96   Pulse (!) 109   Ht 5\' 5"  (1.651 m)   Wt 156 lb (70.8 kg)   LMP 10/25/2018 (Exact Date)   BMI 25.96 kg/m  General:   alert  Skin:   no rash or abnormalities  Lungs:   clear to auscultation bilaterally  Heart:   regular rate and rhythm, S1, S2 normal, no murmur, click, rub or gallop  Breasts:   normal without suspicious masses, skin or nipple changes or axillary nodes  Abdomen:  normal findings: no organomegaly, soft, non-tender and no hernia  Pelvis:  External genitalia: normal general appearance Urinary system: urethral meatus normal and bladder without fullness, nontender Vaginal: normal without tenderness, induration or masses Cervix: normal appearance Adnexa: normal bimanual exam Uterus: anteverted and non-tender, normal size   Lab Review Urine pregnancy test Labs reviewed yes Radiologic studies reviewed yes  50% of 20 min visit spent on counseling and coordination of care.   Assessment:     1. Encounter for routine gynecological examination with Papanicolaou smear of cervix Rx: - Cytology - PAP( [place])  2. Vaginal discharge Rx: - Cervicovaginal ancillary only( [place])  3. BV (bacterial vaginosis) Rx: - tinidazole (TINDAMAX) 500 MG tablet; Take 2 tablets (1,000 mg total) by mouth daily with breakfast.  Dispense: 10 tablet; Refill: 5  4. Screening for STD (sexually transmitted disease) Rx: - Hepatitis B surface antigen - Hepatitis C antibody - RPR - HIV Antibody (routine testing w rflx)  5. Generalized anxiety disorder Rx: - ALPRAZolam (XANAX) 0.5 MG tablet; Take 1 tablet (0.5 mg total) by mouth at bedtime as needed for anxiety.  Dispense: 30 tablet; Refill:  2   Plan:    Education reviewed: calcium supplements, depression evaluation, low fat, low cholesterol diet, safe sex/STD prevention, self breast exams and weight bearing exercise. Contraception: OCP (estrogen/progesterone). Follow up in: 1 year.   Meds ordered this encounter  Medications  . ALPRAZolam (XANAX) 0.5 MG tablet    Sig: Take 1 tablet (0.5 mg total) by mouth at bedtime as needed for anxiety.    Dispense:  30 tablet    Refill:  2  . tinidazole (TINDAMAX) 500 MG tablet    Sig: Take 2 tablets (1,000 mg total) by mouth daily with breakfast.    Dispense:  10 tablet    Refill:  5   Orders Placed This Encounter  Procedures  . Hepatitis B surface antigen  . Hepatitis C antibody  . RPR  . HIV Antibody (routine testing w rflx)    Brock Bad MD 10-30-2018

## 2018-10-31 LAB — HEPATITIS B SURFACE ANTIGEN: Hepatitis B Surface Ag: NEGATIVE

## 2018-10-31 LAB — RPR: RPR Ser Ql: NONREACTIVE

## 2018-10-31 LAB — CERVICOVAGINAL ANCILLARY ONLY
Bacterial vaginitis: NEGATIVE
CANDIDA VAGINITIS: POSITIVE — AB
CHLAMYDIA, DNA PROBE: NEGATIVE
Neisseria Gonorrhea: NEGATIVE
Trichomonas: NEGATIVE

## 2018-10-31 LAB — HEPATITIS C ANTIBODY: Hep C Virus Ab: <0.1 s/co ratio (ref 0.0–0.9)

## 2018-10-31 LAB — HIV ANTIBODY (ROUTINE TESTING W REFLEX): HIV SCREEN 4TH GENERATION: NONREACTIVE

## 2018-11-01 ENCOUNTER — Telehealth: Payer: Self-pay

## 2018-11-01 ENCOUNTER — Other Ambulatory Visit: Payer: Self-pay | Admitting: Obstetrics

## 2018-11-01 DIAGNOSIS — B373 Candidiasis of vulva and vagina: Secondary | ICD-10-CM

## 2018-11-01 DIAGNOSIS — B3731 Acute candidiasis of vulva and vagina: Secondary | ICD-10-CM

## 2018-11-01 MED ORDER — FLUCONAZOLE 150 MG PO TABS: 150.0000 mg | ORAL_TABLET | Freq: Once | ORAL | 0 refills | Status: AC

## 2018-11-01 NOTE — Telephone Encounter (Signed)
Returned call and advised of results and rx. °

## 2018-11-03 LAB — CYTOLOGY - PAP: Diagnosis: NEGATIVE

## 2019-02-13 ENCOUNTER — Other Ambulatory Visit: Payer: Self-pay | Admitting: Obstetrics

## 2019-02-13 DIAGNOSIS — N3000 Acute cystitis without hematuria: Secondary | ICD-10-CM

## 2019-02-13 MED ORDER — CEPHALEXIN 500 MG PO CAPS: 500.0000 mg | ORAL_CAPSULE | Freq: Three times a day (TID) | ORAL | 0 refills | Status: DC

## 2019-04-04 ENCOUNTER — Other Ambulatory Visit: Payer: Self-pay | Admitting: Obstetrics

## 2019-04-04 DIAGNOSIS — Z1231 Encounter for screening mammogram for malignant neoplasm of breast: Secondary | ICD-10-CM

## 2019-05-18 ENCOUNTER — Ambulatory Visit
Admission: RE | Admit: 2019-05-18 | Discharge: 2019-05-18 | Disposition: A | Discharge status: Home / Self Care | Payer: Managed Care, Other (non HMO) | Source: Ambulatory Visit | Attending: Obstetrics | Admitting: Obstetrics

## 2019-05-18 ENCOUNTER — Other Ambulatory Visit: Payer: Self-pay

## 2019-05-18 DIAGNOSIS — Z1231 Encounter for screening mammogram for malignant neoplasm of breast: Secondary | ICD-10-CM

## 2019-05-22 ENCOUNTER — Other Ambulatory Visit: Payer: Self-pay | Admitting: Obstetrics

## 2019-05-22 DIAGNOSIS — Z30011 Encounter for initial prescription of contraceptive pills: Secondary | ICD-10-CM

## 2019-05-30 ENCOUNTER — Other Ambulatory Visit: Payer: Self-pay | Admitting: Obstetrics

## 2019-05-30 DIAGNOSIS — N3000 Acute cystitis without hematuria: Secondary | ICD-10-CM

## 2019-05-30 NOTE — Telephone Encounter (Signed)
Please review for refill.  

## 2019-08-19 ENCOUNTER — Other Ambulatory Visit: Payer: Self-pay | Admitting: Obstetrics

## 2019-08-19 DIAGNOSIS — B9689 Other specified bacterial agents as the cause of diseases classified elsewhere: Secondary | ICD-10-CM

## 2019-08-19 DIAGNOSIS — N760 Acute vaginitis: Secondary | ICD-10-CM

## 2019-11-01 ENCOUNTER — Ambulatory Visit (INDEPENDENT_AMBULATORY_CARE_PROVIDER_SITE_OTHER): Payer: Managed Care, Other (non HMO) | Admitting: Obstetrics

## 2019-11-01 ENCOUNTER — Other Ambulatory Visit: Payer: Self-pay

## 2019-11-01 ENCOUNTER — Encounter: Payer: Self-pay | Admitting: Obstetrics

## 2019-11-01 VITALS — BP 135/96 | HR 118 | Ht 65.0 in | Wt 156.5 lb

## 2019-11-01 DIAGNOSIS — N760 Acute vaginitis: Secondary | ICD-10-CM

## 2019-11-01 DIAGNOSIS — N898 Other specified noninflammatory disorders of vagina: Secondary | ICD-10-CM | POA: Diagnosis not present

## 2019-11-01 DIAGNOSIS — Z01419 Encounter for gynecological examination (general) (routine) without abnormal findings: Secondary | ICD-10-CM

## 2019-11-01 DIAGNOSIS — B9689 Other specified bacterial agents as the cause of diseases classified elsewhere: Secondary | ICD-10-CM

## 2019-11-01 DIAGNOSIS — Z1151 Encounter for screening for human papillomavirus (HPV): Secondary | ICD-10-CM | POA: Diagnosis not present

## 2019-11-01 DIAGNOSIS — F411 Generalized anxiety disorder: Secondary | ICD-10-CM | POA: Diagnosis not present

## 2019-11-01 DIAGNOSIS — N944 Primary dysmenorrhea: Secondary | ICD-10-CM | POA: Diagnosis not present

## 2019-11-01 DIAGNOSIS — Z113 Encounter for screening for infections with a predominantly sexual mode of transmission: Secondary | ICD-10-CM

## 2019-11-01 DIAGNOSIS — Z3041 Encounter for surveillance of contraceptive pills: Secondary | ICD-10-CM

## 2019-11-01 MED ORDER — TINIDAZOLE 500 MG PO TABS: ORAL_TABLET | ORAL | 5 refills | Status: DC

## 2019-11-01 MED ORDER — ALPRAZOLAM 0.5 MG PO TABS: 0.5000 mg | ORAL_TABLET | Freq: Every evening | ORAL | 4 refills | Status: DC | PRN

## 2019-11-01 MED ORDER — IBUPROFEN 600 MG PO TABS: 600.0000 mg | ORAL_TABLET | Freq: Four times a day (QID) | ORAL | 1 refills | Status: DC | PRN

## 2019-11-01 MED ORDER — IBUPROFEN 800 MG PO TABS: 600.0000 mg | ORAL_TABLET | Freq: Three times a day (TID) | ORAL | 5 refills | Status: DC | PRN

## 2019-11-01 NOTE — Progress Notes (Signed)
Patient is in the office for annual. Last pap 10-30-18 Pt is currently not on any BC, recently stopped pills and does not want anything. Pt states that she is not trying to get pregnant.  LMP 10-01-19 Last mammogram 05-18-19

## 2019-11-01 NOTE — Progress Notes (Signed)
Subjective:        Dana Escobar is a 39 y.o. female here for a routine exam.  Current complaints: Having difficulty with focus and concentration since taking on additional job responsibilities.    Personal health questionnaire:  Is patient [name], have a family history of breast and/or ovarian cancer: yes Is there a family history of uterine cancer diagnosed at age < 84, gastrointestinal cancer, urinary tract cancer, family member who is a Field seismologist syndrome-associated carrier: no Is the patient overweight and hypertensive, family history of diabetes, personal history of gestational diabetes, preeclampsia or PCOS: no Is patient over 55, have PCOS,  family history of premature CHD under age 24, diabetes, smoke, have hypertension or peripheral artery disease:  no At any time, has a partner hit, kicked or otherwise hurt or frightened you?: no Over the past 2 weeks, have you felt down, depressed or hopeless?: no Over the past 2 weeks, have you felt little interest or pleasure in doing things?:no   Gynecologic History Patient's last menstrual period was 10/01/2019. Contraception: OCP (estrogen/progesterone) Last Pap: 10-30-2018. Results were: normal Last mammogram: 05-18-2019. Results were: normal  Obstetric History OB History  Gravida Para Term Preterm AB Living  2 1 1   1 1   SAB TAB Ectopic Multiple Live Births    1     1    # Outcome Date GA Lbr Len/2nd Weight Sex Delivery Anes PTL Lv  2 TAB           1 Term      Vag-Spont   LIV    Past Medical History:  Diagnosis Date  . Anxiety   . Depression 02/28/2012  . Fibromyalgia     Past Surgical History:  Procedure Laterality Date  . DILATATION & CURETTAGE/HYSTEROSCOPY WITH MYOSURE N/A 03/22/2018   Procedure: DILATATION & CURETTAGE/HYSTEROSCOPY WITH MYOSURE;  Surgeon: Sloan Leiter, MD;  Location: [facility];  Service: Gynecology;  Laterality: N/A;     Current Outpatient Medications:  .  acetaminophen  (TYLENOL) 500 MG tablet, Take 1,000 mg by mouth every 6 (six) hours as needed for moderate pain., Disp: , Rfl:  .  ALPRAZolam (XANAX) 0.5 MG tablet, Take 1 tablet (0.5 mg total) by mouth at bedtime as needed for anxiety., Disp: 30 tablet, Rfl: 4 .  cephALEXin (KEFLEX) 500 MG capsule, Take 1 capsule (500 mg total) by mouth 4 (four) times daily. (Patient not taking: Reported on 11/01/2019), Disp: 28 capsule, Rfl: 2 .  ibuprofen (ADVIL) 800 MG tablet, Take 1 tablet (800 mg total) by mouth every 8 (eight) hours as needed., Disp: 30 tablet, Rfl: 5 .  LO LOESTRIN FE 1 MG-10 MCG / 10 MCG tablet, TAKE 1 TABLET DAILY (Patient not taking: Reported on 11/01/2019), Disp: 84 tablet, Rfl: 3 .  Lysine 500 MG CAPS, Take 1 capsule by mouth daily., Disp: , Rfl:  .  metroNIDAZOLE (METROGEL) 0.75 % vaginal gel, PLACE 1 APPLICATORFUL VAGINALLY 2 (TWO) TIMES DAILY. (Patient not taking: Reported on 11/01/2019), Disp: 70 g, Rfl: 4 .  oxyCODONE-acetaminophen (PERCOCET/ROXICET) 5-325 MG tablet, Take 1 tablet by mouth every 6 (six) hours as needed. (Patient not taking: Reported on 10/30/2018), Disp: 20 tablet, Rfl: 0 .  tinidazole (TINDAMAX) 500 MG tablet, TAKE 2 TABLETS BY MOUTH EVERY DAY WITH BREAKFAST, Disp: 10 tablet, Rfl: 5 No Known Allergies  Social History   Tobacco Use  . Smoking status: Never Smoker  . Smokeless tobacco: Never Used  Substance Use Topics  .  Alcohol use: Yes    Alcohol/week: 0.0 standard drinks    Comment: ocassionally    Family History  Problem Relation Age of Onset  . Breast cancer Mother 48  . Breast cancer Maternal Grandmother   . Hypertension Maternal Grandfather   . Diabetes Paternal Grandmother       Review of Systems  Constitutional: negative for fatigue and weight loss Respiratory: negative for cough and wheezing Cardiovascular: negative for chest pain, fatigue and palpitations Gastrointestinal: negative for abdominal pain and change in bowel habits Musculoskeletal:negative for  myalgias Neurological: negative for gait problems and tremors Behavioral/Psych: negative for abusive relationship, depression Endocrine: negative for temperature intolerance    Genitourinary:negative for abnormal menstrual periods, genital lesions, hot flashes, sexual problems and vaginal discharge Integument/breast: negative for breast lump, breast tenderness, nipple discharge and skin lesion(s)    Objective:       BP (!) 135/96   Pulse (!) 118   Ht 5\' 5"  (1.651 m)   Wt 156 lb 8 oz (71 kg)   LMP 10/01/2019   BMI 26.04 kg/m  General:   alert  Skin:   no rash or abnormalities  Lungs:   clear to auscultation bilaterally  Heart:   regular rate and rhythm, S1, S2 normal, no murmur, click, rub or gallop  Breasts:   normal without suspicious masses, skin or nipple changes or axillary nodes  Abdomen:  normal findings: no organomegaly, soft, non-tender and no hernia  Pelvis:  External genitalia: normal general appearance Urinary system: urethral meatus normal and bladder without fullness, nontender Vaginal: normal without tenderness, induration or masses Cervix: normal appearance Adnexa: normal bimanual exam Uterus: anteverted and non-tender, normal size   Lab Review Urine pregnancy test Labs reviewed yes Radiologic studies reviewed yes  50% of 25 min visit spent on counseling and coordination of care.   Assessment:     1. Encounter for routine gynecological examination with Papanicolaou smear of cervix Rx: - Cytology - PAP( [place])  2. Primary dysmenorrhea Rx: - ibuprofen (ADVIL) 800 MG tablet; Take 1 tablet (800 mg total) by mouth every 8 (eight) hours as needed.  Dispense: 30 tablet; Refill: 5  3. Vaginal discharge Rx: - Cervicovaginal ancillary only( [place])  4. BV (bacterial vaginosis) Rx: - tinidazole (TINDAMAX) 500 MG tablet; TAKE 2 TABLETS BY MOUTH EVERY DAY WITH BREAKFAST  Dispense: 10 tablet; Refill: 5  5. Screen for STD (sexually transmitted  disease) Rx: - HIV antibody (with reflex) - RPR - Hepatitis B Surface AntiGEN - Hepatitis C Antibody  6. Generalized anxiety disorder Rx: - ALPRAZolam (XANAX) 0.5 MG tablet; Take 1 tablet (0.5 mg total) by mouth at bedtime as needed for anxiety.  Dispense: 30 tablet; Refill: 4  7. Encounter for surveillance of contraceptive pills - pleased with LoLoestrin    Plan:    Education reviewed: calcium supplements, depression evaluation, low fat, low cholesterol diet, safe sex/STD prevention, self breast exams and weight bearing exercise. Contraception: OCP (estrogen/progesterone). Follow up in: 1 year.   Meds ordered this encounter  Medications  . ALPRAZolam (XANAX) 0.5 MG tablet    Sig: Take 1 tablet (0.5 mg total) by mouth at bedtime as needed for anxiety.    Dispense:  30 tablet    Refill:  4  . tinidazole (TINDAMAX) 500 MG tablet    Sig: TAKE 2 TABLETS BY MOUTH EVERY DAY WITH BREAKFAST    Dispense:  10 tablet    Refill:  5    DX Code Needed  .  10/03/2019  DISCONTD: ibuprofen (ADVIL) 600 MG tablet    Sig: Take 1 tablet (600 mg total) by mouth every 6 (six) hours as needed.    Dispense:  60 tablet    Refill:  1  . ibuprofen (ADVIL) 800 MG tablet    Sig: Take 1 tablet (800 mg total) by mouth every 8 (eight) hours as needed.    Dispense:  30 tablet    Refill:  5   Orders Placed This Encounter  Procedures  . HIV antibody (with reflex)  . RPR  . Hepatitis B Surface AntiGEN  . Hepatitis C Antibody    Brock Bad, MD 11/01/2019 12:06 PM

## 2019-11-02 LAB — CERVICOVAGINAL ANCILLARY ONLY
Bacterial Vaginitis (gardnerella): NEGATIVE
Candida Glabrata: NEGATIVE
Candida Vaginitis: NEGATIVE
Chlamydia: NEGATIVE
Comment: NEGATIVE
Comment: NEGATIVE
Comment: NEGATIVE
Comment: NEGATIVE
Comment: NEGATIVE
Comment: NORMAL
Neisseria Gonorrhea: NEGATIVE
Trichomonas: NEGATIVE

## 2019-11-02 LAB — HIV ANTIBODY (ROUTINE TESTING W REFLEX): HIV Screen 4th Generation wRfx: NONREACTIVE

## 2019-11-02 LAB — CYTOLOGY - PAP
Comment: NEGATIVE
Diagnosis: NEGATIVE
High risk HPV: NEGATIVE

## 2019-11-02 LAB — HEPATITIS C ANTIBODY: Hep C Virus Ab: <0.1 s/co ratio (ref 0.0–0.9)

## 2019-11-02 LAB — HEPATITIS B SURFACE ANTIGEN: Hepatitis B Surface Ag: NEGATIVE

## 2019-11-02 LAB — RPR: RPR Ser Ql: NONREACTIVE

## 2019-12-05 ENCOUNTER — Other Ambulatory Visit: Payer: Self-pay | Admitting: Obstetrics

## 2019-12-05 DIAGNOSIS — N3000 Acute cystitis without hematuria: Secondary | ICD-10-CM

## 2020-04-04 ENCOUNTER — Other Ambulatory Visit: Payer: Self-pay | Admitting: Obstetrics

## 2020-04-04 DIAGNOSIS — B9689 Other specified bacterial agents as the cause of diseases classified elsewhere: Secondary | ICD-10-CM

## 2020-04-15 ENCOUNTER — Other Ambulatory Visit: Payer: Self-pay | Admitting: Obstetrics

## 2020-04-15 DIAGNOSIS — Z30011 Encounter for initial prescription of contraceptive pills: Secondary | ICD-10-CM

## 2020-05-06 ENCOUNTER — Other Ambulatory Visit: Payer: Self-pay | Admitting: Obstetrics

## 2020-05-06 DIAGNOSIS — F411 Generalized anxiety disorder: Secondary | ICD-10-CM

## 2020-05-08 ENCOUNTER — Other Ambulatory Visit: Payer: Self-pay | Admitting: Obstetrics

## 2020-05-08 DIAGNOSIS — Z1231 Encounter for screening mammogram for malignant neoplasm of breast: Secondary | ICD-10-CM

## 2020-05-23 ENCOUNTER — Ambulatory Visit
Admission: RE | Admit: 2020-05-23 | Discharge: 2020-05-23 | Disposition: A | Discharge status: Home / Self Care | Payer: Managed Care, Other (non HMO) | Source: Ambulatory Visit | Attending: Obstetrics | Admitting: Obstetrics

## 2020-05-23 ENCOUNTER — Other Ambulatory Visit: Payer: Self-pay

## 2020-05-23 DIAGNOSIS — Z1231 Encounter for screening mammogram for malignant neoplasm of breast: Secondary | ICD-10-CM

## 2020-07-31 ENCOUNTER — Telehealth: Payer: Self-pay

## 2020-07-31 NOTE — Telephone Encounter (Signed)
Returned call, pt states that she started taking lo lo estrogen pills 2 weeks ago and may not have started the week after her period, and she is no reporting bleeding. Advised pt to continue taking pills and finish the month out, if symptoms do not get better, let us know, pt agreed.

## 2020-08-22 ENCOUNTER — Other Ambulatory Visit: Payer: Self-pay | Admitting: Obstetrics

## 2020-08-22 DIAGNOSIS — N3000 Acute cystitis without hematuria: Secondary | ICD-10-CM

## 2020-11-04 ENCOUNTER — Ambulatory Visit: Payer: Managed Care, Other (non HMO) | Admitting: Obstetrics

## 2020-11-12 ENCOUNTER — Encounter: Payer: Self-pay | Admitting: Obstetrics

## 2020-11-12 ENCOUNTER — Other Ambulatory Visit (HOSPITAL_COMMUNITY)
Admission: RE | Admit: 2020-11-12 | Discharge: 2020-11-12 | Disposition: A | Discharge status: Home / Self Care | Payer: Managed Care, Other (non HMO) | Source: Ambulatory Visit | Attending: Obstetrics | Admitting: Obstetrics

## 2020-11-12 ENCOUNTER — Other Ambulatory Visit: Payer: Self-pay

## 2020-11-12 ENCOUNTER — Ambulatory Visit (INDEPENDENT_AMBULATORY_CARE_PROVIDER_SITE_OTHER): Payer: Managed Care, Other (non HMO) | Admitting: Obstetrics

## 2020-11-12 VITALS — BP 136/89 | HR 98 | Ht 65.0 in | Wt 163.0 lb

## 2020-11-12 DIAGNOSIS — N760 Acute vaginitis: Secondary | ICD-10-CM

## 2020-11-12 DIAGNOSIS — Z01419 Encounter for gynecological examination (general) (routine) without abnormal findings: Secondary | ICD-10-CM | POA: Insufficient documentation

## 2020-11-12 DIAGNOSIS — N898 Other specified noninflammatory disorders of vagina: Secondary | ICD-10-CM

## 2020-11-12 DIAGNOSIS — Z113 Encounter for screening for infections with a predominantly sexual mode of transmission: Secondary | ICD-10-CM | POA: Diagnosis not present

## 2020-11-12 DIAGNOSIS — F411 Generalized anxiety disorder: Secondary | ICD-10-CM

## 2020-11-12 DIAGNOSIS — Z1239 Encounter for other screening for malignant neoplasm of breast: Secondary | ICD-10-CM

## 2020-11-12 DIAGNOSIS — B9689 Other specified bacterial agents as the cause of diseases classified elsewhere: Secondary | ICD-10-CM

## 2020-11-12 DIAGNOSIS — N946 Dysmenorrhea, unspecified: Secondary | ICD-10-CM

## 2020-11-12 DIAGNOSIS — Z3041 Encounter for surveillance of contraceptive pills: Secondary | ICD-10-CM

## 2020-11-12 DIAGNOSIS — J301 Allergic rhinitis due to pollen: Secondary | ICD-10-CM

## 2020-11-12 DIAGNOSIS — Z30011 Encounter for initial prescription of contraceptive pills: Secondary | ICD-10-CM

## 2020-11-12 MED ORDER — TRAMADOL HCL 50 MG PO TABS: 50.0000 mg | ORAL_TABLET | Freq: Four times a day (QID) | ORAL | 2 refills | Status: DC | PRN

## 2020-11-12 MED ORDER — ALPRAZOLAM 0.5 MG PO TABS: 0.5000 mg | ORAL_TABLET | Freq: Every evening | ORAL | 4 refills | Status: DC | PRN

## 2020-11-12 MED ORDER — METRONIDAZOLE 500 MG PO TABS: 500.0000 mg | ORAL_TABLET | Freq: Two times a day (BID) | ORAL | 2 refills | Status: DC

## 2020-11-12 MED ORDER — LORATADINE 10 MG PO TABS: 10.0000 mg | ORAL_TABLET | Freq: Every day | ORAL | 11 refills | Status: DC

## 2020-11-12 MED ORDER — METRONIDAZOLE 0.75 % VA GEL: 1.0000 | Freq: Two times a day (BID) | VAGINAL | 4 refills | Status: DC

## 2020-11-12 NOTE — Progress Notes (Addendum)
GYN presents for AEX/PAP, she request full STD screening. Mother had Breast cancer at 40 y.o. She has been getting her Mammograms every year.  PHQ-9=6

## 2020-11-12 NOTE — Progress Notes (Signed)
Subjective:        Dana Escobar is a 40 y.o. female here for a routine exam.  Current complaints: Vaginal discharge.    Personal health questionnaire:  Is patient [name], have a family history of breast and/or ovarian cancer: yes Is there a family history of uterine cancer diagnosed at age < 68, gastrointestinal cancer, urinary tract cancer, family member who is a Personnel officer syndrome-associated carrier: no Is the patient overweight and hypertensive, family history of diabetes, personal history of gestational diabetes, preeclampsia or PCOS: no Is patient over 51, have PCOS,  family history of premature CHD under age 12, diabetes, smoke, have hypertension or peripheral artery disease:  no At any time, has a partner hit, kicked or otherwise hurt or frightened you?: no Over the past 2 weeks, have you felt down, depressed or hopeless?: no Over the past 2 weeks, have you felt little interest or pleasure in doing things?:no   Gynecologic History Patient's last menstrual period was 10/25/2020 (exact date). Contraception: OCP (estrogen/progesterone) Last Pap: 11-01-2019. Results were: normal Last mammogram: 05-23-2020. Results were: normal  Obstetric History OB History  Gravida Para Term Preterm AB Living  2 1 1   1 1   SAB IAB Ectopic Multiple Live Births    1     1    # Outcome Date GA Lbr Len/2nd Weight Sex Delivery Anes PTL Lv  2 IAB           1 Term      Vag-Spont   LIV    Past Medical History:  Diagnosis Date  . Anxiety   . Depression 02/28/2012  . Fibromyalgia     Past Surgical History:  Procedure Laterality Date  . DILATATION & CURETTAGE/HYSTEROSCOPY WITH MYOSURE N/A 03/22/2018   Procedure: DILATATION & CURETTAGE/HYSTEROSCOPY WITH MYOSURE;  Surgeon: 05/22/2018, MD;  Location: [facility];  Service: Gynecology;  Laterality: N/A;     Current Outpatient Medications:  .  loratadine (CLARITIN) 10 MG tablet, Take 1 tablet (10 mg total) by mouth daily.,  Disp: 30 tablet, Rfl: 11 .  metroNIDAZOLE (FLAGYL) 500 MG tablet, Take 1 tablet (500 mg total) by mouth 2 (two) times daily., Disp: 14 tablet, Rfl: 2 .  traMADol (ULTRAM) 50 MG tablet, Take 1 tablet (50 mg total) by mouth every 6 (six) hours as needed., Disp: 30 tablet, Rfl: 2 .  acetaminophen (TYLENOL) 500 MG tablet, Take 1,000 mg by mouth every 6 (six) hours as needed for moderate pain., Disp: , Rfl:  .  ALPRAZolam (XANAX) 0.5 MG tablet, Take 1 tablet (0.5 mg total) by mouth at bedtime as needed for anxiety., Disp: 30 tablet, Rfl: 4 .  cephALEXin (KEFLEX) 500 MG capsule, TAKE 1 CAPSULE BY MOUTH FOUR TIMES A DAY, Disp: 28 capsule, Rfl: 2 .  ibuprofen (ADVIL) 800 MG tablet, Take 1 tablet (800 mg total) by mouth every 8 (eight) hours as needed., Disp: 30 tablet, Rfl: 5 .  LO LOESTRIN FE 1 MG-10 MCG / 10 MCG tablet, TAKE 1 TABLET DAILY, Disp: 84 tablet, Rfl: 3 .  Lysine 500 MG CAPS, Take 1 capsule by mouth daily., Disp: , Rfl:  .  metroNIDAZOLE (METROGEL) 0.75 % vaginal gel, Place 1 Applicatorful vaginally 2 (two) times daily., Disp: 70 g, Rfl: 4 .  oxyCODONE-acetaminophen (PERCOCET/ROXICET) 5-325 MG tablet, Take 1 tablet by mouth every 6 (six) hours as needed. (Patient not taking: Reported on 10/30/2018), Disp: 20 tablet, Rfl: 0 .  tinidazole (TINDAMAX) 500  MG tablet, TAKE 2 TABLETS BY MOUTH EVERY DAY WITH BREAKFAST, Disp: 10 tablet, Rfl: 5 No Known Allergies  Social History   Tobacco Use  . Smoking status: Never Smoker  . Smokeless tobacco: Never Used  Substance Use Topics  . Alcohol use: Yes    Alcohol/week: 0.0 standard drinks    Comment: ocassionally    Family History  Problem Relation Age of Onset  . Breast cancer Mother 67  . Breast cancer Maternal Grandmother   . Hypertension Maternal Grandfather   . Diabetes Paternal Grandmother       Review of Systems  Constitutional: negative for fatigue and weight loss Respiratory: positive for seasonal allergic rhinitis  Cardiovascular:  negative for chest pain, fatigue and palpitations Gastrointestinal: negative for abdominal pain and change in bowel habits Musculoskeletal:negative for myalgias Neurological: negative for gait problems and tremor  Behavioral/Psych: positive for depression / anxiety Endocrine: negative for temperature intolerance    Genitourinary:negative for abnormal menstrual periods, genital lesions, hot flashes, sexual problems.  Positive for vaginal discharge Integument/breast: negative for breast lump, breast tenderness, nipple discharge and skin lesion(s)    Objective:       BP 136/89   Pulse 98   Ht 5\' 5"  (1.651 m)   Wt 163 lb (73.9 kg)   LMP 10/25/2020 (Exact Date)   BMI 27.12 kg/m  General:   alert and no distress  Skin:   no rash or abnormalities  Lungs:   clear to auscultation bilaterally  Heart:   regular rate and rhythm, S1, S2 normal, no murmur, click, rub or gallop  Breasts:   normal without suspicious masses, skin or nipple changes or axillary nodes  Abdomen:  normal findings: no organomegaly, soft, non-tender and no hernia  Pelvis:  External genitalia: normal general appearance Urinary system: urethral meatus normal and bladder without fullness, nontender Vaginal: normal without tenderness, induration or masses Cervix: normal appearance Adnexa: normal bimanual exam Uterus: anteverted and non-tender, normal size   Lab Review Urine pregnancy test Labs reviewed yes Radiologic studies reviewed yes  I have spent a total of 20 minutes of face-to-face time, excluding clinical staff time, reviewing notes and preparing to see patient, ordering tests and/or medications, and counseling the patient.  Assessment:     1. Encounter for routine gynecological examination with Papanicolaou smear of cervix Rx: - Cytology - PAP( [facility])  2. Dysmenorrhea Rx: - traMADol (ULTRAM) 50 MG tablet; Take 1 tablet (50 mg total) by mouth every 6 (six) hours as needed.  Dispense: 30 tablet;  Refill: 2  3. Vaginal discharge Rx: - Cervicovaginal ancillary only( [place])  4. BV (bacterial vaginosis) Rx: - metroNIDAZOLE (FLAGYL) 500 MG tablet; Take 1 tablet (500 mg total) by mouth 2 (two) times daily.  Dispense: 14 tablet; Refill: 2 - metroNIDAZOLE (METROGEL) 0.75 % vaginal gel; Place 1 Applicatorful vaginally 2 (two) times daily.  Dispense: 70 g; Refill: 4  5. Screening for STD (sexually transmitted disease) Rx: - RPR+HBsAg+HCVAb+...  6. Screening breast examination Rx: - MM Digital Screening; Future  7. Encounter for surveillance of contraceptive pills - pleased with Lo Loestrin  8. Seasonal allergic rhinitis due to pollen Rx: - loratadine (CLARITIN) 10 MG tablet; Take 1 tablet (10 mg total) by mouth daily.  Dispense: 30 tablet; Refill: 11  9. Generalized anxiety disorder Rx: - ALPRAZolam (XANAX) 0.5 MG tablet; Take 1 tablet (0.5 mg total) by mouth at bedtime as needed for anxiety.  Dispense: 30 tablet; Refill: 4    Plan:  Education reviewed: calcium supplements, depression evaluation, low fat, low cholesterol diet, safe sex/STD prevention, self breast exams and weight bearing exercise. Contraception: OCP (estrogen/progesterone). Mammogram ordered. Follow up in: 1 year.   Meds ordered this encounter  Medications  . loratadine (CLARITIN) 10 MG tablet    Sig: Take 1 tablet (10 mg total) by mouth daily.    Dispense:  30 tablet    Refill:  11  . ALPRAZolam (XANAX) 0.5 MG tablet    Sig: Take 1 tablet (0.5 mg total) by mouth at bedtime as needed for anxiety.    Dispense:  30 tablet    Refill:  4  . metroNIDAZOLE (FLAGYL) 500 MG tablet    Sig: Take 1 tablet (500 mg total) by mouth 2 (two) times daily.    Dispense:  14 tablet    Refill:  2  . metroNIDAZOLE (METROGEL) 0.75 % vaginal gel    Sig: Place 1 Applicatorful vaginally 2 (two) times daily.    Dispense:  70 g    Refill:  4  . traMADol (ULTRAM) 50 MG tablet    Sig: Take 1 tablet (50 mg total) by  mouth every 6 (six) hours as needed.    Dispense:  30 tablet    Refill:  2   Orders Placed This Encounter  Procedures  . MM Digital Screening    Standing Status:   Future    Standing Expiration Date:   11/11/2021    Order Specific Question:   Reason for Exam (SYMPTOM  OR DIAGNOSIS REQUIRED)    Answer:   Screening    Order Specific Question:   Is the patient pregnant?    Answer:   No    Order Specific Question:   Preferred imaging location?    Answer:   [facility]  . RPR+HBsAg+HCVAb+...    Brock Bad, MD 11/12/2020 10:40 AM

## 2020-11-13 LAB — CERVICOVAGINAL ANCILLARY ONLY
Bacterial Vaginitis (gardnerella): NEGATIVE
Candida Glabrata: NEGATIVE
Candida Vaginitis: NEGATIVE
Chlamydia: NEGATIVE
Comment: NEGATIVE
Comment: NEGATIVE
Comment: NEGATIVE
Comment: NEGATIVE
Comment: NEGATIVE
Comment: NORMAL
Neisseria Gonorrhea: NEGATIVE
Trichomonas: NEGATIVE

## 2020-11-13 LAB — CYTOLOGY - PAP
Comment: NEGATIVE
Diagnosis: NEGATIVE
High risk HPV: NEGATIVE

## 2020-11-13 LAB — RPR+HBSAG+HCVAB+...
HIV Screen 4th Generation wRfx: NONREACTIVE
Hep C Virus Ab: <0.1 s/co ratio (ref 0.0–0.9)
Hepatitis B Surface Ag: NEGATIVE
RPR Ser Ql: NONREACTIVE

## 2021-01-25 ENCOUNTER — Other Ambulatory Visit: Payer: Self-pay | Admitting: Obstetrics

## 2021-01-25 DIAGNOSIS — N3000 Acute cystitis without hematuria: Secondary | ICD-10-CM

## 2021-03-10 ENCOUNTER — Other Ambulatory Visit: Payer: Self-pay | Admitting: Obstetrics

## 2021-03-10 DIAGNOSIS — Z30011 Encounter for initial prescription of contraceptive pills: Secondary | ICD-10-CM

## 2021-05-25 ENCOUNTER — Ambulatory Visit
Admission: RE | Admit: 2021-05-25 | Discharge: 2021-05-25 | Disposition: A | Discharge status: Home / Self Care | Payer: Managed Care, Other (non HMO) | Source: Ambulatory Visit | Attending: Obstetrics | Admitting: Obstetrics

## 2021-05-25 ENCOUNTER — Other Ambulatory Visit: Payer: Self-pay

## 2021-05-25 DIAGNOSIS — Z1239 Encounter for other screening for malignant neoplasm of breast: Secondary | ICD-10-CM

## 2021-11-13 ENCOUNTER — Ambulatory Visit (INDEPENDENT_AMBULATORY_CARE_PROVIDER_SITE_OTHER): Payer: Managed Care, Other (non HMO) | Admitting: Obstetrics

## 2021-11-13 ENCOUNTER — Other Ambulatory Visit (HOSPITAL_COMMUNITY)
Admission: RE | Admit: 2021-11-13 | Discharge: 2021-11-13 | Disposition: A | Discharge status: Home / Self Care | Payer: Managed Care, Other (non HMO) | Source: Ambulatory Visit | Attending: Obstetrics | Admitting: Obstetrics

## 2021-11-13 ENCOUNTER — Encounter: Payer: Self-pay | Admitting: Obstetrics

## 2021-11-13 VITALS — BP 136/93 | HR 105 | Ht 64.0 in | Wt 160.3 lb

## 2021-11-13 DIAGNOSIS — N898 Other specified noninflammatory disorders of vagina: Secondary | ICD-10-CM

## 2021-11-13 DIAGNOSIS — Z113 Encounter for screening for infections with a predominantly sexual mode of transmission: Secondary | ICD-10-CM

## 2021-11-13 DIAGNOSIS — B9689 Other specified bacterial agents as the cause of diseases classified elsewhere: Secondary | ICD-10-CM

## 2021-11-13 DIAGNOSIS — F411 Generalized anxiety disorder: Secondary | ICD-10-CM

## 2021-11-13 DIAGNOSIS — Z01419 Encounter for gynecological examination (general) (routine) without abnormal findings: Secondary | ICD-10-CM | POA: Insufficient documentation

## 2021-11-13 DIAGNOSIS — N946 Dysmenorrhea, unspecified: Secondary | ICD-10-CM

## 2021-11-13 DIAGNOSIS — F439 Reaction to severe stress, unspecified: Secondary | ICD-10-CM

## 2021-11-13 DIAGNOSIS — N760 Acute vaginitis: Secondary | ICD-10-CM

## 2021-11-13 MED ORDER — ALPRAZOLAM 0.5 MG PO TABS: 0.5000 mg | ORAL_TABLET | Freq: Every evening | ORAL | 4 refills | Status: DC | PRN

## 2021-11-13 MED ORDER — METRONIDAZOLE 0.75 % VA GEL: 1.0000 | Freq: Two times a day (BID) | VAGINAL | 4 refills | Status: DC

## 2021-11-13 MED ORDER — TRAMADOL HCL 50 MG PO TABS: 50.0000 mg | ORAL_TABLET | Freq: Four times a day (QID) | ORAL | 2 refills | Status: DC | PRN

## 2021-11-13 MED ORDER — METRONIDAZOLE 500 MG PO TABS: 500.0000 mg | ORAL_TABLET | Freq: Two times a day (BID) | ORAL | 4 refills | Status: DC

## 2021-11-13 NOTE — Progress Notes (Signed)
Pt presents for annual exam.  ?Desires STD screen and blood work. ?Last Pap 11/12/2020 ?Last Mammo 05/25/2021 ?Referral for [facility] for increased stress d/t recent care taking duties of her husband.  ? ?

## 2021-11-13 NOTE — Progress Notes (Signed)
? ?Subjective: ? ? ?  ?  ? Dana Escobar is a 41 y.o. female here for a routine exam.  Current complaints: Vaginal discharge.  Also c/o stress at home. ? ?Personal health questionnaire:  ?Is patient [name], have a family history of breast and/or ovarian cancer: yes ?Is there a family history of uterine cancer diagnosed at age < 3550, gastrointestinal cancer, urinary tract cancer, family member who is a Personnel officerLynch syndrome-associated carrier: no ?Is the patient overweight and hypertensive, family history of diabetes, personal history of gestational diabetes, preeclampsia or PCOS: no ?Is patient over 7855, have PCOS,  family history of premature CHD under age 41, diabetes, smoke, have hypertension or peripheral artery disease:  no ?At any time, has a partner hit, kicked or otherwise hurt or frightened you?: no ?Over the past 2 weeks, have you felt down, depressed or hopeless?: no ?Over the past 2 weeks, have you felt little interest or pleasure in doing things?:no ? ? ?Gynecologic History ?Patient's last menstrual period was 10/30/2021. ?Contraception: condoms ?Last Pap: 2022. Results were: normal ?Last mammogram: 2022. Results were: normal ? ?Obstetric History ?OB History  ?Gravida Para Term Preterm AB Living  ?2 1 1   1 1   ?SAB IAB Ectopic Multiple Live Births  ?  1     1  ?  ?# Outcome Date GA Lbr Len/2nd Weight Sex Delivery Anes PTL Lv  ?2 IAB           ?1 Term      Vag-Spont   LIV  ? ? ?Past Medical History:  ?Diagnosis Date  ? Anxiety   ? Depression 02/28/2012  ? Fibromyalgia   ?  ?Past Surgical History:  ?Procedure Laterality Date  ? DILATATION & CURETTAGE/HYSTEROSCOPY WITH MYOSURE N/A 03/22/2018  ? Procedure: DILATATION & CURETTAGE/HYSTEROSCOPY WITH MYOSURE;  Surgeon: Conan Bowensavis, Kelly M, MD;  Location: [facility];  Service: Gynecology;  Laterality: N/A;  ?  ? ?Current Outpatient Medications:  ?  acetaminophen (TYLENOL) 500 MG tablet, Take 1,000 mg by mouth every 6 (six) hours as needed for moderate  pain., Disp: , Rfl:  ?  LO LOESTRIN FE 1 MG-10 MCG / 10 MCG tablet, TAKE 1 TABLET DAILY, Disp: 84 tablet, Rfl: 3 ?  Lysine 500 MG CAPS, Take 1 capsule by mouth daily., Disp: , Rfl:  ?  ALPRAZolam (XANAX) 0.5 MG tablet, Take 1 tablet (0.5 mg total) by mouth at bedtime as needed for anxiety., Disp: 30 tablet, Rfl: 4 ?  ibuprofen (ADVIL) 800 MG tablet, Take 1 tablet (800 mg total) by mouth every 8 (eight) hours as needed. (Patient not taking: Reported on 11/13/2021), Disp: 30 tablet, Rfl: 5 ?  loratadine (CLARITIN) 10 MG tablet, Take 1 tablet (10 mg total) by mouth daily. (Patient not taking: Reported on 11/13/2021), Disp: 30 tablet, Rfl: 11 ?  metroNIDAZOLE (FLAGYL) 500 MG tablet, Take 1 tablet (500 mg total) by mouth 2 (two) times daily., Disp: 14 tablet, Rfl: 4 ?  metroNIDAZOLE (METROGEL) 0.75 % vaginal gel, Place 1 Applicatorful vaginally 2 (two) times daily., Disp: 70 g, Rfl: 4 ?  traMADol (ULTRAM) 50 MG tablet, Take 1 tablet (50 mg total) by mouth every 6 (six) hours as needed., Disp: 30 tablet, Rfl: 2 ?Allergies  ?Allergen Reactions  ? Duloxetine Hcl Nausea And Vomiting  ?  ?Social History  ? ?Tobacco Use  ? Smoking status: Never  ? Smokeless tobacco: Never  ?Substance Use Topics  ? Alcohol use: Yes  ?  Alcohol/week: 0.0 standard drinks  ?  Comment: ocassionally  ?  ?Family History  ?Problem Relation Age of Onset  ? Breast cancer Mother 82  ? Cancer Maternal Grandmother   ? Breast cancer Maternal Grandmother   ? Hypertension Maternal Grandfather   ? Diabetes Paternal Grandmother   ?  ? ? ?Review of Systems ? ?Constitutional: negative for fatigue and weight loss ?Respiratory: negative for cough and wheezing ?Cardiovascular: negative for chest pain, fatigue and palpitations ?Gastrointestinal: negative for abdominal pain and change in bowel habits ?Musculoskeletal:negative for myalgias ?Neurological: negative for gait problems and tremors ?Behavioral/Psych: negative for abusive relationship, depression ?Endocrine:  negative for temperature intolerance    ?Genitourinary: positive for vaginal discharge.  negative for abnormal menstrual periods, genital lesions, hot flashes, sexual problems  ?Integument/breast: negative for breast lump, breast tenderness, nipple discharge and skin lesion(s) ? ?  ?Objective:  ? ?    ?BP (!) 136/93   Pulse (!) 105   Ht 5\' 4"  (1.626 m)   Wt 160 lb 4.8 oz (72.7 kg)   LMP 10/30/2021   BMI 27.52 kg/m?  ?General:   Alert and no distress  ?Skin:   no rash or abnormalities  ?Lungs:   clear to auscultation bilaterally  ?Heart:   regular rate and rhythm, S1, S2 normal, no murmur, click, rub or gallop  ?Breasts:   normal without suspicious masses, skin or nipple changes or axillary nodes  ?Abdomen:  normal findings: no organomegaly, soft, non-tender and no hernia  ?Pelvis:  External genitalia: normal general appearance ?Urinary system: urethral meatus normal and bladder without fullness, nontender ?Vaginal: normal without tenderness, induration or masses ?Cervix: normal appearance ?Adnexa: normal bimanual exam ?Uterus: anteverted and non-tender, normal size  ? ?Lab Review ?Urine pregnancy test ?Labs reviewed yes ?Radiologic studies reviewed yes ? ?I have spent a total of 20 minutes of face-to-face a time, excluding clinical staff time, reviewing notes and preparing to see patient, ordering tests and/or medications, and counseling the patient.  ? ?Assessment:  ? ? 1. Encounter for gynecological examination with Papanicolaou smear of cervix ?Rx: ?- Cytology - PAP( [place]) ? ?2. Dysmenorrhea ?Rx: ?- traMADol (ULTRAM) 50 MG tablet; Take 1 tablet (50 mg total) by mouth every 6 (six) hours as needed.  Dispense: 30 tablet; Refill: 2 ? ?3. Vaginal discharge ?Rx: ?- Cervicovaginal ancillary only( [place]) ? ?4. Screening for STD (sexually transmitted disease) ?Rx: ?- HIV Antibody (routine testing w rflx) ?- RPR ?- Hepatitis B Surface AntiGEN ?- Hepatitis C Antibody ? ?5. Stress at home ?Rx: ?-  Ambulatory referral to Integrated Behavioral Health ? ?6. BV (bacterial vaginosis) ?Rx: ?- metroNIDAZOLE (METROGEL) 0.75 % vaginal gel; Place 1 Applicatorful vaginally 2 (two) times daily.  Dispense: 70 g; Refill: 4 ?- metroNIDAZOLE (FLAGYL) 500 MG tablet; Take 1 tablet (500 mg total) by mouth 2 (two) times daily.  Dispense: 14 tablet; Refill: 4 ? ?7. Generalized anxiety disorder ?Rx: ?- ALPRAZolam (XANAX) 0.5 MG tablet; Take 1 tablet (0.5 mg total) by mouth at bedtime as needed for anxiety.  Dispense: 30 tablet; Refill: 4  ? ?  ?Plan:  ? ? Education reviewed: calcium supplements, depression evaluation, low fat, low cholesterol diet, safe sex/STD prevention, self breast exams, and weight bearing exercise. ?Contraception: abstinence and condoms. ?Follow up in: 1 year.  ? ?Meds ordered this encounter  ?Medications  ? metroNIDAZOLE (METROGEL) 0.75 % vaginal gel  ?  Sig: Place 1 Applicatorful vaginally 2 (two) times daily.  ?  Dispense:  70 g  ?  Refill:  4  ? metroNIDAZOLE (FLAGYL) 500 MG tablet  ?  Sig: Take 1 tablet (500 mg total) by mouth 2 (two) times daily.  ?  Dispense:  14 tablet  ?  Refill:  4  ? ALPRAZolam (XANAX) 0.5 MG tablet  ?  Sig: Take 1 tablet (0.5 mg total) by mouth at bedtime as needed for anxiety.  ?  Dispense:  30 tablet  ?  Refill:  4  ? traMADol (ULTRAM) 50 MG tablet  ?  Sig: Take 1 tablet (50 mg total) by mouth every 6 (six) hours as needed.  ?  Dispense:  30 tablet  ?  Refill:  2  ? ?Orders Placed This Encounter  ?Procedures  ? HIV Antibody (routine testing w rflx)  ? RPR  ? Hepatitis B Surface AntiGEN  ? Hepatitis C Antibody  ? Ambulatory referral to Integrated Behavioral Health  ?  Referral Priority:   Routine  ?  Referral Type:   Consultation  ?  Referral Reason:   Specialty Services Required  ?  Number of Visits Requested:   1  ? ? ? ? Brock Bad, MD ?11/13/2021 12:02 PM  ?

## 2021-11-14 LAB — HEPATITIS C ANTIBODY: Hep C Virus Ab: NONREACTIVE

## 2021-11-14 LAB — HIV ANTIBODY (ROUTINE TESTING W REFLEX): HIV Screen 4th Generation wRfx: NONREACTIVE

## 2021-11-14 LAB — HEPATITIS B SURFACE ANTIGEN: Hepatitis B Surface Ag: NEGATIVE

## 2021-11-14 LAB — RPR: RPR Ser Ql: NONREACTIVE

## 2021-11-16 LAB — CYTOLOGY - PAP
Comment: NEGATIVE
Diagnosis: NEGATIVE
High risk HPV: NEGATIVE

## 2021-11-16 LAB — CERVICOVAGINAL ANCILLARY ONLY
Bacterial Vaginitis (gardnerella): NEGATIVE
Candida Glabrata: NEGATIVE
Candida Vaginitis: NEGATIVE
Chlamydia: NEGATIVE
Comment: NEGATIVE
Comment: NEGATIVE
Comment: NEGATIVE
Comment: NEGATIVE
Comment: NEGATIVE
Comment: NORMAL
Neisseria Gonorrhea: NEGATIVE
Trichomonas: NEGATIVE

## 2021-11-26 ENCOUNTER — Other Ambulatory Visit: Payer: Self-pay | Admitting: Obstetrics

## 2021-11-26 DIAGNOSIS — J301 Allergic rhinitis due to pollen: Secondary | ICD-10-CM

## 2021-12-01 ENCOUNTER — Encounter: Payer: Managed Care, Other (non HMO) | Admitting: Licensed Clinical Social Worker

## 2022-02-12 ENCOUNTER — Other Ambulatory Visit: Payer: Self-pay | Admitting: Obstetrics

## 2022-02-12 DIAGNOSIS — Z30011 Encounter for initial prescription of contraceptive pills: Secondary | ICD-10-CM

## 2022-03-10 ENCOUNTER — Other Ambulatory Visit: Payer: Self-pay | Admitting: Obstetrics

## 2022-03-10 DIAGNOSIS — N3000 Acute cystitis without hematuria: Secondary | ICD-10-CM

## 2022-04-19 ENCOUNTER — Other Ambulatory Visit: Payer: Self-pay | Admitting: Obstetrics

## 2022-04-19 DIAGNOSIS — B9689 Other specified bacterial agents as the cause of diseases classified elsewhere: Secondary | ICD-10-CM

## 2022-04-26 ENCOUNTER — Other Ambulatory Visit: Payer: Self-pay | Admitting: Obstetrics

## 2022-04-26 DIAGNOSIS — Z1231 Encounter for screening mammogram for malignant neoplasm of breast: Secondary | ICD-10-CM

## 2022-04-27 ENCOUNTER — Other Ambulatory Visit: Payer: Self-pay | Admitting: Obstetrics and Gynecology

## 2022-04-27 DIAGNOSIS — Z30011 Encounter for initial prescription of contraceptive pills: Secondary | ICD-10-CM

## 2022-05-26 ENCOUNTER — Ambulatory Visit
Admission: RE | Admit: 2022-05-26 | Discharge: 2022-05-26 | Disposition: A | Discharge status: Home / Self Care | Payer: Managed Care, Other (non HMO) | Source: Ambulatory Visit | Attending: Obstetrics | Admitting: Obstetrics

## 2022-05-26 DIAGNOSIS — Z1231 Encounter for screening mammogram for malignant neoplasm of breast: Secondary | ICD-10-CM

## 2022-05-27 ENCOUNTER — Other Ambulatory Visit: Payer: Self-pay | Admitting: Obstetrics

## 2022-05-27 DIAGNOSIS — R928 Other abnormal and inconclusive findings on diagnostic imaging of breast: Secondary | ICD-10-CM

## 2022-06-05 ENCOUNTER — Ambulatory Visit
Admission: RE | Admit: 2022-06-05 | Discharge: 2022-06-05 | Disposition: A | Discharge status: Home / Self Care | Payer: Managed Care, Other (non HMO) | Source: Ambulatory Visit | Attending: Obstetrics | Admitting: Obstetrics

## 2022-06-05 DIAGNOSIS — R928 Other abnormal and inconclusive findings on diagnostic imaging of breast: Secondary | ICD-10-CM

## 2022-11-19 ENCOUNTER — Other Ambulatory Visit (HOSPITAL_COMMUNITY)
Admission: RE | Admit: 2022-11-19 | Discharge: 2022-11-19 | Disposition: A | Discharge status: Home / Self Care | Payer: Managed Care, Other (non HMO) | Source: Ambulatory Visit | Attending: Obstetrics | Admitting: Obstetrics

## 2022-11-19 ENCOUNTER — Encounter: Payer: Self-pay | Admitting: Obstetrics

## 2022-11-19 ENCOUNTER — Ambulatory Visit (INDEPENDENT_AMBULATORY_CARE_PROVIDER_SITE_OTHER): Payer: Managed Care, Other (non HMO) | Admitting: Obstetrics

## 2022-11-19 VITALS — BP 127/88 | HR 82 | Ht 65.0 in | Wt 167.0 lb

## 2022-11-19 DIAGNOSIS — N760 Acute vaginitis: Secondary | ICD-10-CM | POA: Diagnosis not present

## 2022-11-19 DIAGNOSIS — B9689 Other specified bacterial agents as the cause of diseases classified elsewhere: Secondary | ICD-10-CM

## 2022-11-19 DIAGNOSIS — Z30011 Encounter for initial prescription of contraceptive pills: Secondary | ICD-10-CM | POA: Diagnosis not present

## 2022-11-19 DIAGNOSIS — N898 Other specified noninflammatory disorders of vagina: Secondary | ICD-10-CM | POA: Insufficient documentation

## 2022-11-19 DIAGNOSIS — Z1339 Encounter for screening examination for other mental health and behavioral disorders: Secondary | ICD-10-CM | POA: Diagnosis not present

## 2022-11-19 DIAGNOSIS — Z01419 Encounter for gynecological examination (general) (routine) without abnormal findings: Secondary | ICD-10-CM | POA: Insufficient documentation

## 2022-11-19 DIAGNOSIS — F411 Generalized anxiety disorder: Secondary | ICD-10-CM

## 2022-11-19 DIAGNOSIS — Z113 Encounter for screening for infections with a predominantly sexual mode of transmission: Secondary | ICD-10-CM

## 2022-11-19 DIAGNOSIS — N946 Dysmenorrhea, unspecified: Secondary | ICD-10-CM

## 2022-11-19 DIAGNOSIS — J301 Allergic rhinitis due to pollen: Secondary | ICD-10-CM

## 2022-11-19 MED ORDER — METRONIDAZOLE 0.75 % VA GEL: 1.0000 | Freq: Two times a day (BID) | VAGINAL | 4 refills | Status: DC

## 2022-11-19 MED ORDER — ALPRAZOLAM 0.5 MG PO TABS: 0.5000 mg | ORAL_TABLET | Freq: Every evening | ORAL | 4 refills | Status: DC | PRN

## 2022-11-19 MED ORDER — LORATADINE 10 MG PO TABS: 10.0000 mg | ORAL_TABLET | Freq: Every day | ORAL | 11 refills | Status: DC

## 2022-11-19 MED ORDER — LO LOESTRIN FE 1 MG-10 MCG / 10 MCG PO TABS: 1.0000 | ORAL_TABLET | Freq: Every day | ORAL | 2 refills | Status: DC

## 2022-11-19 MED ORDER — TRAMADOL HCL 50 MG PO TABS: 50.0000 mg | ORAL_TABLET | Freq: Four times a day (QID) | ORAL | 2 refills | Status: DC | PRN

## 2022-11-19 NOTE — Progress Notes (Signed)
41 y.o. GYN presents for AEX/PAP/STD screening. 

## 2022-11-19 NOTE — Progress Notes (Signed)
Subjective:        Dana Escobar is a 42 y.o. female here for a routine exam.  Current complaints: Vaginal discharge..    Personal health questionnaire:  Is patient [name], have a family history of breast and/or ovarian cancer: yes Is there a family history of uterine cancer diagnosed at age < 6150, gastrointestinal cancer, urinary tract cancer, family member who is a Personnel officerLynch syndrome-associated carrier: no Is the patient overweight and hypertensive, family history of diabetes, personal history of gestational diabetes, preeclampsia or PCOS: no Is patient over 7255, have PCOS,  family history of premature CHD under age 42, diabetes, smoke, have hypertension or peripheral artery disease:  no At any time, has a partner hit, kicked or otherwise hurt or frightened you?: no Over the past 2 weeks, have you felt down, depressed or hopeless?: no Over the past 2 weeks, have you felt little interest or pleasure in doing things?:no   Gynecologic History Patient's last menstrual period was 11/06/2022 (exact date). Contraception: OCP (estrogen/progesterone) Last Pap: 2023. Results were: normal Last mammogram: 2023. Results were: normal  Obstetric History OB History  Gravida Para Term Preterm AB Living  2 1 1   1 1   SAB IAB Ectopic Multiple Live Births    1     1    # Outcome Date GA Lbr Len/2nd Weight Sex Delivery Anes PTL Lv  2 IAB           1 Term      Vag-Spont   LIV    Past Medical History:  Diagnosis Date   Anxiety    Depression 02/28/2012   Fibromyalgia     Past Surgical History:  Procedure Laterality Date   DILATATION & CURETTAGE/HYSTEROSCOPY WITH MYOSURE N/A 03/22/2018   Procedure: DILATATION & CURETTAGE/HYSTEROSCOPY WITH MYOSURE;  Surgeon: Conan Bowensavis, Kelly M, MD;  Location: [facility];  Service: Gynecology;  Laterality: N/A;     Current Outpatient Medications:    acetaminophen (TYLENOL) 500 MG tablet, Take 1,000 mg by mouth every 6 (six) hours as needed for  moderate pain., Disp: , Rfl:    ALPRAZolam (XANAX) 0.5 MG tablet, Take 1 tablet (0.5 mg total) by mouth at bedtime as needed for anxiety., Disp: 30 tablet, Rfl: 4   ibuprofen (ADVIL) 800 MG tablet, Take 1 tablet (800 mg total) by mouth every 8 (eight) hours as needed. (Patient not taking: Reported on 11/13/2021), Disp: 30 tablet, Rfl: 5   loratadine (CLARITIN) 10 MG tablet, TAKE 1 TABLET BY MOUTH EVERY DAY, Disp: 30 tablet, Rfl: 11   Lysine 500 MG CAPS, Take 1 capsule by mouth daily., Disp: , Rfl:    metroNIDAZOLE (FLAGYL) 500 MG tablet, Take 1 tablet (500 mg total) by mouth 2 (two) times daily., Disp: 14 tablet, Rfl: 4   metroNIDAZOLE (METROGEL) 0.75 % vaginal gel, PLACE 1 APPLICATORFUL VAGINALLY 2 (TWO) TIMES DAILY., Disp: 70 g, Rfl: 4   Norethindrone-Ethinyl Estradiol-Fe Biphas (LO LOESTRIN FE) 1 MG-10 MCG / 10 MCG tablet, Take 1 tablet by mouth daily., Disp: 84 tablet, Rfl: 2   traMADol (ULTRAM) 50 MG tablet, Take 1 tablet (50 mg total) by mouth every 6 (six) hours as needed., Disp: 30 tablet, Rfl: 2 Allergies  Allergen Reactions   Duloxetine Hcl Nausea And Vomiting    Social History   Tobacco Use   Smoking status: Never   Smokeless tobacco: Never  Substance Use Topics   Alcohol use: Yes    Alcohol/week: 0.0 standard drinks  of alcohol    Comment: ocassionally    Family History  Problem Relation Age of Onset   Breast cancer Mother 3   Cancer Maternal Grandmother    Breast cancer Maternal Grandmother    Hypertension Maternal Grandfather    Diabetes Paternal Grandmother       Review of Systems  Constitutional: negative for fatigue and weight loss Respiratory: negative for cough and wheezing Cardiovascular: negative for chest pain, fatigue and palpitations Gastrointestinal: negative for abdominal pain and change in bowel habits Musculoskeletal:negative for myalgias Neurological: negative for gait problems and tremors Behavioral/Psych: negative for abusive relationship,  depression Endocrine: negative for temperature intolerance    Genitourinary:negative for abnormal menstrual periods, genital lesions, hot flashes, sexual problems and vaginal discharge Integument/breast: negative for breast lump, breast tenderness, nipple discharge and skin lesion(s)    Objective:       BP 127/88   Pulse 82   Ht 5\' 5"  (1.651 m)   Wt 167 lb (75.8 kg)   LMP 11/06/2022 (Exact Date)   BMI 27.79 kg/m  General:   alert  Skin:   no rash or abnormalities  Lungs:   clear to auscultation bilaterally  Heart:   regular rate and rhythm, S1, S2 normal, no murmur, click, rub or gallop  Breasts:   normal without suspicious masses, skin or nipple changes or axillary nodes  Abdomen:  normal findings: no organomegaly, soft, non-tender and no hernia  Pelvis:  External genitalia: normal general appearance Urinary system: urethral meatus normal and bladder without fullness, nontender Vaginal: normal without tenderness, induration or masses Cervix: normal appearance Adnexa: normal bimanual exam Uterus: anteverted and non-tender, normal size   Lab Review Urine pregnancy test Labs reviewed {YES NO:22349} Radiologic studies reviewed {YES NO:22349}  ***% of *** min visit spent on counseling and coordination of care.   Assessment:    Healthy female exam.    Plan:    {plan:19193}   Meds ordered this encounter  Medications   Norethindrone-Ethinyl Estradiol-Fe Biphas (LO LOESTRIN FE) 1 MG-10 MCG / 10 MCG tablet    Sig: Take 1 tablet by mouth daily.    Dispense:  84 tablet    Refill:  2   Orders Placed This Encounter  Procedures   HIV antibody (with reflex)   Hepatitis C Antibody   Hepatitis B Surface AntiGEN   RPR   Need to obtain previous records Possible management options include:*** Follow up as needed. ***    Brock Bad, MD 11/19/2022 9:12 AM

## 2022-11-20 LAB — RPR: RPR Ser Ql: NONREACTIVE

## 2022-11-20 LAB — HEPATITIS B SURFACE ANTIGEN: Hepatitis B Surface Ag: NEGATIVE

## 2022-11-20 LAB — HEPATITIS C ANTIBODY: Hep C Virus Ab: NONREACTIVE

## 2022-11-20 LAB — HIV ANTIBODY (ROUTINE TESTING W REFLEX): HIV Screen 4th Generation wRfx: NONREACTIVE

## 2022-11-22 ENCOUNTER — Other Ambulatory Visit: Payer: Self-pay | Admitting: Obstetrics

## 2022-11-22 DIAGNOSIS — B379 Candidiasis, unspecified: Secondary | ICD-10-CM

## 2022-11-22 LAB — CERVICOVAGINAL ANCILLARY ONLY
Bacterial Vaginitis (gardnerella): NEGATIVE
Candida Glabrata: POSITIVE — AB
Candida Vaginitis: NEGATIVE
Chlamydia: NEGATIVE
Comment: NEGATIVE
Comment: NEGATIVE
Comment: NEGATIVE
Comment: NEGATIVE
Comment: NEGATIVE
Comment: NORMAL
Neisseria Gonorrhea: NEGATIVE
Trichomonas: NEGATIVE

## 2022-11-22 MED ORDER — AZO BORIC ACID 600 MG VA SUPP: 1.0000 | Freq: Every day | VAGINAL | 0 refills | Status: AC

## 2022-11-22 NOTE — Progress Notes (Signed)
TC. Advised pt of need for Boric Acid to [facility] and that RX is only available at [facility]. Pt verbalized understanding.

## 2022-11-24 LAB — CYTOLOGY - PAP
Comment: NEGATIVE
Diagnosis: NEGATIVE
High risk HPV: NEGATIVE

## 2023-01-04 ENCOUNTER — Other Ambulatory Visit: Payer: Self-pay | Admitting: Obstetrics and Gynecology

## 2023-01-04 DIAGNOSIS — Z30011 Encounter for initial prescription of contraceptive pills: Secondary | ICD-10-CM

## 2023-04-26 ENCOUNTER — Other Ambulatory Visit: Payer: Self-pay | Admitting: Internal Medicine

## 2023-04-26 DIAGNOSIS — Z1231 Encounter for screening mammogram for malignant neoplasm of breast: Secondary | ICD-10-CM

## 2023-06-08 ENCOUNTER — Other Ambulatory Visit: Payer: Self-pay | Admitting: Obstetrics

## 2023-06-08 DIAGNOSIS — B9689 Other specified bacterial agents as the cause of diseases classified elsewhere: Secondary | ICD-10-CM

## 2023-06-16 ENCOUNTER — Ambulatory Visit
Admission: RE | Admit: 2023-06-16 | Discharge: 2023-06-16 | Disposition: A | Discharge status: Home / Self Care | Payer: Managed Care, Other (non HMO) | Source: Ambulatory Visit | Attending: Internal Medicine | Admitting: Internal Medicine

## 2023-06-16 DIAGNOSIS — Z1231 Encounter for screening mammogram for malignant neoplasm of breast: Secondary | ICD-10-CM

## 2023-07-06 ENCOUNTER — Other Ambulatory Visit: Payer: Self-pay | Admitting: Obstetrics

## 2023-07-06 DIAGNOSIS — N3000 Acute cystitis without hematuria: Secondary | ICD-10-CM

## 2023-07-06 MED ORDER — CEFUROXIME AXETIL 500 MG PO TABS: 500.0000 mg | ORAL_TABLET | Freq: Two times a day (BID) | ORAL | 0 refills | Status: AC

## 2023-12-06 ENCOUNTER — Encounter: Payer: Self-pay | Admitting: Obstetrics

## 2023-12-06 ENCOUNTER — Other Ambulatory Visit (HOSPITAL_COMMUNITY)
Admission: RE | Admit: 2023-12-06 | Discharge: 2023-12-06 | Disposition: A | Discharge status: Home / Self Care | Source: Ambulatory Visit | Attending: Obstetrics | Admitting: Obstetrics

## 2023-12-06 ENCOUNTER — Ambulatory Visit: Admitting: Obstetrics

## 2023-12-06 VITALS — BP 123/90 | HR 105 | Wt 152.0 lb

## 2023-12-06 DIAGNOSIS — Z01419 Encounter for gynecological examination (general) (routine) without abnormal findings: Secondary | ICD-10-CM

## 2023-12-06 DIAGNOSIS — Z3041 Encounter for surveillance of contraceptive pills: Secondary | ICD-10-CM

## 2023-12-06 DIAGNOSIS — N926 Irregular menstruation, unspecified: Secondary | ICD-10-CM

## 2023-12-06 DIAGNOSIS — J301 Allergic rhinitis due to pollen: Secondary | ICD-10-CM

## 2023-12-06 DIAGNOSIS — N760 Acute vaginitis: Secondary | ICD-10-CM

## 2023-12-06 DIAGNOSIS — N944 Primary dysmenorrhea: Secondary | ICD-10-CM

## 2023-12-06 DIAGNOSIS — Z113 Encounter for screening for infections with a predominantly sexual mode of transmission: Secondary | ICD-10-CM

## 2023-12-06 DIAGNOSIS — F411 Generalized anxiety disorder: Secondary | ICD-10-CM

## 2023-12-06 DIAGNOSIS — N946 Dysmenorrhea, unspecified: Secondary | ICD-10-CM

## 2023-12-06 DIAGNOSIS — B9689 Other specified bacterial agents as the cause of diseases classified elsewhere: Secondary | ICD-10-CM

## 2023-12-06 DIAGNOSIS — Z30011 Encounter for initial prescription of contraceptive pills: Secondary | ICD-10-CM

## 2023-12-06 DIAGNOSIS — N898 Other specified noninflammatory disorders of vagina: Secondary | ICD-10-CM | POA: Diagnosis not present

## 2023-12-06 MED ORDER — TRAMADOL HCL 50 MG PO TABS: 50.0000 mg | ORAL_TABLET | Freq: Four times a day (QID) | ORAL | 2 refills | Status: AC | PRN

## 2023-12-06 MED ORDER — IBUPROFEN 800 MG PO TABS: 800.0000 mg | ORAL_TABLET | Freq: Three times a day (TID) | ORAL | 5 refills | Status: DC | PRN

## 2023-12-06 MED ORDER — LO LOESTRIN FE 1 MG-10 MCG / 10 MCG PO TABS: 1.0000 | ORAL_TABLET | Freq: Every day | ORAL | 2 refills | Status: AC

## 2023-12-06 MED ORDER — METRONIDAZOLE 0.75 % VA GEL: 1.0000 | Freq: Two times a day (BID) | VAGINAL | 4 refills | Status: AC

## 2023-12-06 MED ORDER — ALPRAZOLAM 0.5 MG PO TABS: 0.5000 mg | ORAL_TABLET | Freq: Every evening | ORAL | 4 refills | Status: AC | PRN

## 2023-12-06 MED ORDER — LO LOESTRIN FE 1 MG-10 MCG / 10 MCG PO TABS: 1.0000 | ORAL_TABLET | Freq: Every day | ORAL | 4 refills | Status: AC

## 2023-12-06 MED ORDER — LORATADINE 10 MG PO TABS: 10.0000 mg | ORAL_TABLET | Freq: Every day | ORAL | 11 refills | Status: AC

## 2023-12-06 NOTE — Progress Notes (Addendum)
 Subjective:        Dana Escobar is a 43 y.o. female here for a routine exam.  Current complaints: Vaginal discharge..  Irregular periods.  Personal health questionnaire:  Is patient [name], have a family history of breast and/or ovarian cancer: yes Is there a family history of uterine cancer diagnosed at age < 71, gastrointestinal cancer, urinary tract cancer, family member who is a Personnel officer syndrome-associated carrier: no Is the patient overweight and hypertensive, family history of diabetes, personal history of gestational diabetes, preeclampsia or PCOS: no Is patient over 42, have PCOS,  family history of premature CHD under age 69, diabetes, smoke, have hypertension or peripheral artery disease:  no At any time, has a partner hit, kicked or otherwise hurt or frightened you?: no Over the past 2 weeks, have you felt down, depressed or hopeless?: no Over the past 2 weeks, have you felt little interest or pleasure in doing things?:no   Gynecologic History Patient's last menstrual period was 11/24/2023 (exact date). Contraception: OCP (estrogen/progesterone) Last Pap: 11-19-2022. Results were: normal Last mammogram: 06-16-2023. Results were: normal  Obstetric History OB History  Gravida Para Term Preterm AB Living  2 1 1  1 1   SAB IAB Ectopic Multiple Live Births   1   1    # Outcome Date GA Lbr Len/2nd Weight Sex Type Anes PTL Lv  2 IAB           1 Term      Vag-Spont   LIV    Past Medical History:  Diagnosis Date   Anxiety    Depression 02/28/2012   Fibromyalgia     Past Surgical History:  Procedure Laterality Date   DILATATION & CURETTAGE/HYSTEROSCOPY WITH MYOSURE N/A 03/22/2018   Procedure: DILATATION & CURETTAGE/HYSTEROSCOPY WITH MYOSURE;  Surgeon: Jan Mcgill, MD;  Location: [facility];  Service: Gynecology;  Laterality: N/A;     Current Outpatient Medications:    acetaminophen  (TYLENOL ) 500 MG tablet, Take 1,000 mg by mouth every 6 (six)  hours as needed for moderate pain., Disp: , Rfl:    LO LOESTRIN FE  1 MG-10 MCG / 10 MCG tablet, Take 1 tablet by mouth daily., Disp: 84 tablet, Rfl: 4   metoprolol tartrate (LOPRESSOR) 25 MG tablet, Take 25 mg by mouth 2 (two) times daily., Disp: , Rfl:    ALPRAZolam  (XANAX ) 0.5 MG tablet, Take 1 tablet (0.5 mg total) by mouth at bedtime as needed for anxiety., Disp: 30 tablet, Rfl: 4   Boric Acid Vaginal (AZO BORIC ACID) 600 MG SUPP, Place 1 suppository vaginally at bedtime. (Patient not taking: Reported on 12/06/2023), Disp: 14 suppository, Rfl: 0   cefUROXime  (CEFTIN ) 500 MG tablet, Take 1 tablet (500 mg total) by mouth 2 (two) times daily with a meal. (Patient not taking: Reported on 12/06/2023), Disp: 14 tablet, Rfl: 0   ibuprofen  (ADVIL ) 800 MG tablet, Take 1 tablet (800 mg total) by mouth every 8 (eight) hours as needed., Disp: 30 tablet, Rfl: 5   loratadine  (CLARITIN ) 10 MG tablet, Take 1 tablet (10 mg total) by mouth daily., Disp: 30 tablet, Rfl: 11   Lysine 500 MG CAPS, Take 1 capsule by mouth daily. (Patient not taking: Reported on 12/06/2023), Disp: , Rfl:    metroNIDAZOLE  (FLAGYL ) 500 MG tablet, TAKE 1 TABLET BY MOUTH TWICE A DAY (Patient not taking: Reported on 12/06/2023), Disp: 14 tablet, Rfl: 4   metroNIDAZOLE  (METROGEL ) 0.75 % vaginal gel, Place 1 Applicatorful vaginally 2 (  two) times daily., Disp: 70 g, Rfl: 4   Norethindrone-Ethinyl Estradiol-Fe Biphas (LO LOESTRIN FE ) 1 MG-10 MCG / 10 MCG tablet, Take 1 tablet by mouth daily., Disp: 84 tablet, Rfl: 2   traMADol  (ULTRAM ) 50 MG tablet, Take 1 tablet (50 mg total) by mouth every 6 (six) hours as needed., Disp: 30 tablet, Rfl: 2 Allergies  Allergen Reactions   Duloxetine Hcl Nausea And Vomiting    Social History   Tobacco Use   Smoking status: Never   Smokeless tobacco: Never  Substance Use Topics   Alcohol use: Yes    Alcohol/week: 0.0 standard drinks of alcohol    Comment: ocassionally    Family History  Problem Relation  Age of Onset   Breast cancer Mother 10   Cancer Maternal Grandmother    Breast cancer Maternal Grandmother 79   Hypertension Maternal Grandfather    Diabetes Paternal Grandmother       Review of Systems  Constitutional: negative for fatigue and weight loss Respiratory: negative for cough and wheezing Cardiovascular: negative for chest pain, fatigue and palpitations Gastrointestinal: negative for abdominal pain and change in bowel habits Musculoskeletal:negative for myalgias Neurological: negative for gait problems and tremors Behavioral/Psych: negative for abusive relationship, depression Endocrine: negative for temperature intolerance    Genitourinary: positive for vaginal discharge and irregular menstrual periods.  Negative for, genital lesions, hot flashes, sexual problems  Integument/breast: negative for breast lump, breast tenderness, nipple discharge and skin lesion(s)    Objective:       BP (!) 123/90   Pulse (!) 105   Wt 152 lb (68.9 kg)   LMP 11/24/2023 (Exact Date)   BMI 25.29 kg/m  General:   Alert and no distress  Skin:   no rash or abnormalities  Lungs:   clear to auscultation bilaterally  Heart:   regular rate and rhythm, S1, S2 normal, no murmur, click, rub or gallop  Breasts:   normal without suspicious masses, skin or nipple changes or axillary nodes  Abdomen:  normal findings: no organomegaly, soft, non-tender and no hernia  Pelvis:  External genitalia: normal general appearance Urinary system: urethral meatus normal and bladder without fullness, nontender Vaginal: normal without tenderness, induration or masses Cervix: normal appearance Adnexa: normal bimanual exam Uterus: anteverted and non-tender, normal size   Lab Review Urine pregnancy test Labs reviewed yes Radiologic studies reviewed yes  I have spent a total of 25 minutes of face-to-face time, excluding clinical staff time, reviewing notes and preparing to see patient, ordering tests and/or  medications, and counseling the patient.   Assessment:    1. Encounter for gynecological examination with Papanicolaou smear of cervix (Primary) Rx: - Cytology - PAP( [place])  2. Menstrual periods irregular Rx: - US  PELVIC COMPLETE WITH TRANSVAGINAL; Future  3. Primary dysmenorrhea Rx: - ibuprofen  (ADVIL ) 800 MG tablet; Take 1 tablet (800 mg total) by mouth every 8 (eight) hours as needed.  Dispense: 30 tablet; Refill: 5 - traMADol  (ULTRAM ) 50 MG tablet; Take 1 tablet (50 mg total) by mouth every 6 (six) hours as needed.  Dispense: 30 tablet; Refill: 2  4. Vaginal discharge Rx: - Cervicovaginal ancillary only( [place])  5. BV (bacterial vaginosis) Rx: - metroNIDAZOLE  (METROGEL ) 0.75 % vaginal gel; Place 1 Applicatorful vaginally 2 (two) times daily.  Dispense: 70 g; Refill: 4  6. Screening for STD (sexually transmitted disease) Rx: - Hepatitis B Surface AntiGEN - Hepatitis C Antibody - HIV antibody (with reflex) - RPR  7. Encounter for  surveillance of contraceptive pills Rx: - LO LOESTRIN FE  1 MG-10 MCG / 10 MCG tablet; Take 1 tablet by mouth daily.  Dispense: 84 tablet; Refill: 4 - Norethindrone-Ethinyl Estradiol-Fe Biphas (LO LOESTRIN FE ) 1 MG-10 MCG / 10 MCG tablet; Take 1 tablet by mouth daily.  Dispense: 84 tablet; Refill: 2 - may need a stronger OCP  8. Generalized anxiety disorder Rx: - ALPRAZolam  (XANAX ) 0.5 MG tablet; Take 1 tablet (0.5 mg total) by mouth at bedtime as needed for anxiety.  Dispense: 30 tablet; Refill: 4  9. Seasonal allergic rhinitis due to pollen Rx: - loratadine  (CLARITIN ) 10 MG tablet; Take 1 tablet (10 mg total) by mouth daily.  Dispense: 30 tablet; Refill: 11     Plan:    Education reviewed: calcium supplements, depression evaluation, low fat, low cholesterol diet, safe sex/STD prevention, self breast exams, and weight bearing exercise. Contraception: OCP (estrogen/progesterone). Follow up in: 1 year.   Meds ordered this  encounter  Medications   LO LOESTRIN FE  1 MG-10 MCG / 10 MCG tablet    Sig: Take 1 tablet by mouth daily.    Dispense:  84 tablet    Refill:  4    Submit other coverage code 3    BIN: [id number]  PCN: [id number]    GRP: [id number]    ID: [id number]   loratadine  (CLARITIN ) 10 MG tablet    Sig: Take 1 tablet (10 mg total) by mouth daily.    Dispense:  30 tablet    Refill:  11    DX Code Needed  PT HAS AN APT NEXT WEEK.   Norethindrone-Ethinyl Estradiol-Fe Biphas (LO LOESTRIN FE ) 1 MG-10 MCG / 10 MCG tablet    Sig: Take 1 tablet by mouth daily.    Dispense:  84 tablet    Refill:  2   ibuprofen  (ADVIL ) 800 MG tablet    Sig: Take 1 tablet (800 mg total) by mouth every 8 (eight) hours as needed.    Dispense:  30 tablet    Refill:  5   traMADol  (ULTRAM ) 50 MG tablet    Sig: Take 1 tablet (50 mg total) by mouth every 6 (six) hours as needed.    Dispense:  30 tablet    Refill:  2   ALPRAZolam  (XANAX ) 0.5 MG tablet    Sig: Take 1 tablet (0.5 mg total) by mouth at bedtime as needed for anxiety.    Dispense:  30 tablet    Refill:  4   metroNIDAZOLE  (METROGEL ) 0.75 % vaginal gel    Sig: Place 1 Applicatorful vaginally 2 (two) times daily.    Dispense:  70 g    Refill:  4   Orders Placed This Encounter  Procedures   US  PELVIC COMPLETE WITH TRANSVAGINAL    Standing Status:   Future    Expiration Date:   12/05/2024    Reason for Exam (SYMPTOM  OR DIAGNOSIS REQUIRED):   irregular periods    Preferred imaging location?:   [facility]   Hepatitis B Surface AntiGEN   Hepatitis C Antibody   HIV antibody (with reflex)   RPR     Gabrielle Joiner, MD, FACOG Attending Obstetrician & Gynecologist, [facility], [facility], [place] 12/06/2023

## 2023-12-06 NOTE — Progress Notes (Signed)
 Pt reports very light, occasional irregular periods. Requests pap. In February had two periods. First was dark but two weeks later had regular period. Had mammogram in October 2024.

## 2023-12-07 LAB — CERVICOVAGINAL ANCILLARY ONLY
Bacterial Vaginitis (gardnerella): NEGATIVE
Chlamydia: NEGATIVE
Comment: NEGATIVE
Comment: NEGATIVE
Comment: NEGATIVE
Comment: NORMAL
Neisseria Gonorrhea: NEGATIVE
Trichomonas: NEGATIVE

## 2023-12-09 LAB — HIV ANTIBODY (ROUTINE TESTING W REFLEX): HIV Screen 4th Generation wRfx: NONREACTIVE

## 2023-12-09 LAB — HEPATITIS B SURFACE ANTIGEN: Hepatitis B Surface Ag: NEGATIVE

## 2023-12-09 LAB — RPR: RPR Ser Ql: NONREACTIVE

## 2023-12-09 LAB — HEPATITIS C ANTIBODY: Hep C Virus Ab: NONREACTIVE

## 2023-12-12 ENCOUNTER — Ambulatory Visit (HOSPITAL_BASED_OUTPATIENT_CLINIC_OR_DEPARTMENT_OTHER)
Admission: RE | Admit: 2023-12-12 | Discharge: 2023-12-12 | Disposition: A | Discharge status: Home / Self Care | Source: Ambulatory Visit | Attending: Obstetrics | Admitting: Obstetrics

## 2023-12-12 DIAGNOSIS — N926 Irregular menstruation, unspecified: Secondary | ICD-10-CM | POA: Insufficient documentation

## 2023-12-12 LAB — CYTOLOGY - PAP
Comment: NEGATIVE
Diagnosis: UNDETERMINED — AB
High risk HPV: NEGATIVE

## 2023-12-13 NOTE — Progress Notes (Signed)
 TC. Advised of Pap results and need for repeat Pap in 1 year. All questions answered. Pt asking about f/u visit to go over recent pelvic US  results. Results available for provider. MyChart visit scheduled to discuss results and POC.

## 2023-12-19 ENCOUNTER — Encounter: Payer: Self-pay | Admitting: Obstetrics

## 2023-12-19 ENCOUNTER — Telehealth: Admitting: Obstetrics

## 2023-12-19 VITALS — Ht 65.0 in | Wt 151.0 lb

## 2023-12-19 DIAGNOSIS — R399 Unspecified symptoms and signs involving the genitourinary system: Secondary | ICD-10-CM | POA: Diagnosis not present

## 2023-12-19 DIAGNOSIS — N926 Irregular menstruation, unspecified: Secondary | ICD-10-CM

## 2023-12-19 DIAGNOSIS — F32A Depression, unspecified: Secondary | ICD-10-CM | POA: Diagnosis not present

## 2023-12-19 DIAGNOSIS — R3989 Other symptoms and signs involving the genitourinary system: Secondary | ICD-10-CM

## 2023-12-19 DIAGNOSIS — F419 Anxiety disorder, unspecified: Secondary | ICD-10-CM

## 2023-12-19 MED ORDER — FLUOXETINE HCL 10 MG PO CAPS: 10.0000 mg | ORAL_CAPSULE | Freq: Every day | ORAL | 11 refills | Status: AC

## 2023-12-19 NOTE — Progress Notes (Signed)
 Pt presents for U/S results.

## 2023-12-19 NOTE — Progress Notes (Addendum)
 GYNECOLOGY VIRTUAL VISIT ENCOUNTER NOTE  Provider location: Center for [facility]   Patient location: Home  I connected with Dana Escobar on 12/19/23 at 11:15 AM EDT by MyChart Video Encounter and verified that I am speaking with the correct person using two identifiers.   I discussed the limitations, risks, security and privacy concerns of performing an evaluation and management service virtually and the availability of in person appointments. I also discussed with the patient that there may be a patient responsible charge related to this service. The patient expressed understanding and agreed to proceed.   History:  Dana Escobar is a 43 y.o. [id number] female being evaluated today for follow up for irregular periods.  Ultrasound done.. She denies any abnormal vaginal discharge, bleeding, pelvic pain or other concerns.       Past Medical History:  Diagnosis Date   Anxiety    Depression 02/28/2012   Fibromyalgia    Past Surgical History:  Procedure Laterality Date   DILATATION & CURETTAGE/HYSTEROSCOPY WITH MYOSURE N/A 03/22/2018   Procedure: DILATATION & CURETTAGE/HYSTEROSCOPY WITH MYOSURE;  Surgeon: Jan Mcgill, MD;  Location: [facility];  Service: Gynecology;  Laterality: N/A;   The following portions of the patient's history were reviewed and updated as appropriate: allergies, current medications, past family history, past medical history, past social history, past surgical history and problem list.   Health Maintenance:  ASCUS pap and negative HRHPV on 12/06/2023.  Normal mammogram on 06/15/2024.   Review of Systems:  Pertinent items noted in HPI and remainder of comprehensive ROS otherwise negative.  Physical Exam:   General:  Alert, oriented and cooperative. Patient appears to be in no acute distress.  Mental Status: Normal mood and affect. Normal behavior. Normal judgment and thought content.   Respiratory: Normal respiratory effort,  no problems with respiration noted  Rest of physical exam deferred due to type of encounter  Labs and Imaging Results for orders placed or performed in visit on 12/06/23 (from the past 2 weeks)  Cytology - PAP( [place])   Collection Time: 12/06/23  9:52 AM  Result Value Ref Range   High risk HPV Negative    Adequacy      Satisfactory for evaluation; transformation zone component PRESENT.   Diagnosis (A)     - Atypical squamous cells of undetermined significance (ASC-US )   Comment Normal Reference Range HPV - Negative   Cervicovaginal ancillary only( [place])   Collection Time: 12/06/23  9:52 AM  Result Value Ref Range   Neisseria Gonorrhea Negative    Chlamydia Negative    Trichomonas Negative    Bacterial Vaginitis (gardnerella) Negative    Comment      Normal Reference Range Bacterial Vaginosis - Negative   Comment Normal Reference Range Trichomonas - Negative    Comment Normal Reference [place] Chlamydia - Negative    Comment      Normal Reference Range Neisseria Gonorrhea - Negative  Hepatitis B Surface AntiGEN   Collection Time: 12/06/23 10:06 AM  Result Value Ref Range   Hepatitis B Surface Ag Negative Negative  Hepatitis C Antibody   Collection Time: 12/06/23 10:06 AM  Result Value Ref Range   Hep C Virus Ab Non Reactive Non Reactive  HIV antibody (with reflex)   Collection Time: 12/06/23 10:06 AM  Result Value Ref Range   HIV Screen 4th Generation wRfx Non Reactive Non Reactive  RPR   Collection Time: 12/06/23 10:06 AM  Result  Value Ref Range   RPR Ser Ql Non Reactive Non Reactive   US  PELVIC COMPLETE WITH TRANSVAGINAL Result Date: 12/12/2023 CLINICAL DATA:  Irregular periods. EXAM: TRANSABDOMINAL AND TRANSVAGINAL ULTRASOUND OF PELVIS TECHNIQUE: Both transabdominal and transvaginal ultrasound examinations of the pelvis were performed. Transabdominal technique was performed for global imaging of the pelvis including uterus, ovaries, adnexal regions, and  pelvic cul-de-sac. It was necessary to proceed with endovaginal exam following the transabdominal exam to visualize the endometrium and ovaries. COMPARISON:  Dec 20, 2017 FINDINGS: Uterus Measurements: 8.4 x 4.6 x 6.4 cm = volume: 129.4 ML. Uterine fibroids are noted, largest measures 2.6 x 1.7 x 2.4 cm. Endometrium Thickness: 7.6 mm.  No focal abnormality visualized. Right ovary Not visualized. Left ovary Measurements: 3 x 1.6 x 2.8 cm = volume: 6.8 mL. Normal appearance/no adnexal mass. Other findings No abnormal free fluid. IMPRESSION: 1. No acute abnormality identified. 2. Endometrium measures 7.6 mm. If bleeding remains unresponsive to hormonal or medical therapy, sonohysterogram should be considered for focal lesion work-up. (Ref: Radiological Reasoning: Algorithmic Workup of Abnormal Vaginal Bleeding with Endovaginal Sonography and Sonohysterography. AJR 2008; [id number]) 3. Uterine fibroids. Electronically Signed   By: Anna Barnes M.D.   On: 12/12/2023 14:44       Assessment and Plan:     1. Menstrual periods irregular (Primary) - normal ultrasound - continue Lo Loestrin  10.  May need to increase the dosage to 20 micrograms    2. Anxiety and depression Rx: - FLUoxetine (PROZAC) 10 MG capsule; Take 1 capsule (10 mg total) by mouth daily.  Dispense: 30 capsule; Refill: 11  3. Suspected UTI Rx: - Urine Culture; Future       I discussed the assessment and treatment plan with the patient. The patient was provided an opportunity to ask questions and all were answered. The patient agreed with the plan and demonstrated an understanding of the instructions.   The patient was advised to call back or seek an in-person evaluation/go to the ED if the symptoms worsen or if the condition fails to improve as anticipated.  I provided 10 minutes of non face-to-face time during this encounter. I also spent 10 minutes dedicated to the care of this patient including pre-visit review of records, post visit  ordering of medications and appropriate tests or procedures, coordinating care and documenting this visit encounter.    Elana Grayer, MD Center for [facility], [facility], [place] 12/19/2023

## 2023-12-27 ENCOUNTER — Ambulatory Visit

## 2023-12-27 ENCOUNTER — Ambulatory Visit (INDEPENDENT_AMBULATORY_CARE_PROVIDER_SITE_OTHER): Admitting: Podiatry

## 2023-12-27 DIAGNOSIS — M25579 Pain in unspecified ankle and joints of unspecified foot: Secondary | ICD-10-CM

## 2023-12-27 NOTE — Progress Notes (Signed)
 1. Pain in joint involving ankle and foot, unspecified laterality     Patient rescheduled to extended hours physician appointment due to trauma of ankle.

## 2024-01-05 ENCOUNTER — Ambulatory Visit: Admitting: Podiatry

## 2024-05-08 ENCOUNTER — Other Ambulatory Visit: Payer: Self-pay | Admitting: Internal Medicine

## 2024-05-08 DIAGNOSIS — Z1231 Encounter for screening mammogram for malignant neoplasm of breast: Secondary | ICD-10-CM

## 2024-06-18 ENCOUNTER — Ambulatory Visit
Admission: RE | Admit: 2024-06-18 | Discharge: 2024-06-18 | Disposition: A | Source: Ambulatory Visit | Attending: Internal Medicine | Admitting: Internal Medicine

## 2024-06-18 DIAGNOSIS — Z1231 Encounter for screening mammogram for malignant neoplasm of breast: Secondary | ICD-10-CM

## 2024-06-21 ENCOUNTER — Other Ambulatory Visit: Payer: Self-pay | Admitting: Internal Medicine

## 2024-06-21 DIAGNOSIS — R928 Other abnormal and inconclusive findings on diagnostic imaging of breast: Secondary | ICD-10-CM

## 2024-07-03 ENCOUNTER — Other Ambulatory Visit: Payer: Self-pay | Admitting: Obstetrics and Gynecology

## 2024-07-03 DIAGNOSIS — B9689 Other specified bacterial agents as the cause of diseases classified elsewhere: Secondary | ICD-10-CM

## 2024-07-04 ENCOUNTER — Ambulatory Visit
Admission: RE | Admit: 2024-07-04 | Discharge: 2024-07-04 | Disposition: A | Discharge status: Home / Self Care | Source: Ambulatory Visit | Attending: Internal Medicine | Admitting: Internal Medicine

## 2024-07-04 DIAGNOSIS — R928 Other abnormal and inconclusive findings on diagnostic imaging of breast: Secondary | ICD-10-CM

## 2024-07-05 ENCOUNTER — Other Ambulatory Visit: Payer: Self-pay | Admitting: Obstetrics

## 2024-07-05 ENCOUNTER — Other Ambulatory Visit: Payer: Self-pay

## 2024-07-05 DIAGNOSIS — N944 Primary dysmenorrhea: Secondary | ICD-10-CM

## 2024-07-05 DIAGNOSIS — B9689 Other specified bacterial agents as the cause of diseases classified elsewhere: Secondary | ICD-10-CM

## 2024-07-05 MED ORDER — IBUPROFEN 800 MG PO TABS: 800.0000 mg | ORAL_TABLET | Freq: Three times a day (TID) | ORAL | 2 refills | Status: AC | PRN

## 2024-07-05 MED ORDER — METRONIDAZOLE 0.75 % VA GEL: 1.0000 | Freq: Every day | VAGINAL | 0 refills | Status: AC

## 2024-07-26 ENCOUNTER — Other Ambulatory Visit: Payer: Self-pay | Admitting: Obstetrics

## 2024-07-26 DIAGNOSIS — B9689 Other specified bacterial agents as the cause of diseases classified elsewhere: Secondary | ICD-10-CM
